# Patient Record
Sex: Female | Born: 1980 | Race: White | Hispanic: No | Smoking: Current every day smoker
Health system: Southern US, Community
[De-identification: ages and names within clinical notes are randomized; demographics above are authoritative.]

## PROBLEM LIST (undated history)

## (undated) DIAGNOSIS — R569 Unspecified convulsions: Secondary | ICD-10-CM

## (undated) HISTORY — PX: TONSILLECTOMY: SUR1361

## (undated) HISTORY — PX: CHOLECYSTECTOMY: SHX55

## (undated) HISTORY — PX: BACK SURGERY: SHX140

## (undated) HISTORY — PX: OVARY SURGERY: SHX727

---

## 2009-07-22 ENCOUNTER — Encounter: Admission: RE | Admit: 2009-07-22 | Discharge: 2009-07-22 | Payer: Self-pay | Admitting: Family Medicine

## 2010-12-12 ENCOUNTER — Encounter (HOSPITAL_COMMUNITY)
Admission: RE | Admit: 2010-12-12 | Discharge: 2010-12-12 | Disposition: A | Discharge status: Home / Self Care | Payer: BC Managed Care – PPO | Source: Ambulatory Visit | Attending: Neurological Surgery | Admitting: Neurological Surgery

## 2010-12-12 LAB — CBC
HCT: 41.3 % (ref 36.0–46.0)
Hemoglobin: 14.8 g/dL (ref 12.0–15.0)
MCHC: 35.8 g/dL (ref 30.0–36.0)
MCV: 92.4 fL (ref 78.0–100.0)
RBC: 4.47 MIL/uL (ref 3.87–5.11)
RDW: 12.3 % (ref 11.5–15.5)

## 2010-12-14 ENCOUNTER — Observation Stay (HOSPITAL_COMMUNITY)
Admission: RE | Admit: 2010-12-14 | Discharge: 2010-12-14 | Disposition: A | Discharge status: Home / Self Care | Payer: BC Managed Care – PPO | Source: Ambulatory Visit | Attending: Neurological Surgery | Admitting: Neurological Surgery

## 2010-12-14 ENCOUNTER — Ambulatory Visit (HOSPITAL_COMMUNITY): Payer: BC Managed Care – PPO

## 2010-12-14 DIAGNOSIS — M5126 Other intervertebral disc displacement, lumbar region: Principal | ICD-10-CM | POA: Insufficient documentation

## 2010-12-14 DIAGNOSIS — Z01812 Encounter for preprocedural laboratory examination: Secondary | ICD-10-CM | POA: Insufficient documentation

## 2011-01-08 NOTE — Op Note (Signed)
NAMEJENET, DURIO              ACCOUNT NO.:  [id number]  MEDICAL RECORD NO.:  [id number]           PATIENT TYPE:  O  LOCATION:  3536                         FACILITY:  [facility]  PHYSICIAN:  Stefani Dama, M.D.  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  PROCEDURE:  Microdiskectomy for recurrent herniated nucleus pulposus L4- L5 right with operating microscope and microdissection technique.  SURGEON:  Stefani Dama, MD  ANESTHESIA:  General endotracheal.  INDICATIONS:  Fantasia is a 30 year old individual who a few years back had a herniated nucleus pulposus at L4-L5 on the right.  This was operated on in [place] and she did well; however, she has developed recurrence of pain in the past several weeks with weakness in the tibialis anterior group on the right side.  Pain has been severe and she has not been tolerating pain medication and not been tolerating the pain very well with the weakness in the leg.  She was advised regarding surgical decompression procedures.  The patient is now taken to the operating room.  PROCEDURE:  The patient was brought to the operating room, supine on the stretcher.  After smooth induction of general endotracheal anesthesia, she was turned prone.  Back was prepped with alcohol and DuraPrep and draped in sterile fashion.  Previously identified centralized scar was reopened and dissection was taken down through the lumbodorsal fascia to expose the interlaminar space at L4-L5 on the right side which was identified positively with a singular radiograph.  Then, a laminotomy was created removing some overlying scar tissue and thickened  scar in the interlaminar space at L4-5 on the right side.  A medial facetectomy was performed removing the inferior margin of the facet and the medial margin of the facet at L4-L5.  The common dural tube was then identified and gradually we were able to dissect the common dural tube and nerve root, and on the lateral aspect inferior to the common dural tube just at the takeoff of the L5 nerve root there was a fragment of disk that was identified.  This was removed.  Further dissection yielded several other small fragments of disk and one of the fragments appeared to travel under the nerve root in the foramen.  A foraminotomy was created to more greatly mobilize the nerve root and several other small fragments were found in this region.  The area was inspected carefully. Further dissection yielded no other fragments of disk material.  The ligament was then carefully dissected around all the borders.  The common dural tube was noted to be free and clear.  Hemostasis in the soft tissues was meticulously obtained.  A 20 mL of 0.5% Marcaine was injected into the paraspinous fascia and musculature and then the lumbodorsal fascia was closed with 2-0 Vicryl in interrupted fashion.  A 2-0 Vicryl was used in the subcutaneous tissues, 3-0 Vicryl subcuticularly,  and Dermabond on the skin.  Blood loss was estimated less than 50 mL.  The patient tolerated the procedure well and was returned to recovery room in stable condition.     Stefani Dama, M.D.     Merla Riches  D:  12/14/2010  T:  12/15/2010  Job:  [id number]  Electronically Signed by Barnett Abu M.D. on 01/08/2011 10:13:18 AM

## 2012-06-13 ENCOUNTER — Emergency Department
Admission: EM | Admit: 2012-06-13 | Discharge: 2012-06-13 | Disposition: A | Discharge status: Home / Self Care | Payer: Self-pay | Source: Home / Self Care

## 2012-06-13 ENCOUNTER — Encounter: Payer: Self-pay | Admitting: *Deleted

## 2012-06-13 DIAGNOSIS — J40 Bronchitis, not specified as acute or chronic: Secondary | ICD-10-CM

## 2012-06-13 DIAGNOSIS — R062 Wheezing: Secondary | ICD-10-CM

## 2012-06-13 DIAGNOSIS — J069 Acute upper respiratory infection, unspecified: Secondary | ICD-10-CM

## 2012-06-13 DIAGNOSIS — F17200 Nicotine dependence, unspecified, uncomplicated: Secondary | ICD-10-CM

## 2012-06-13 MED ORDER — IPRATROPIUM BROMIDE 0.02 % IN SOLN
0.5000 mg | Freq: Once | RESPIRATORY_TRACT | Status: AC
  Administered 2012-06-13: 0.5 mg via RESPIRATORY_TRACT

## 2012-06-13 MED ORDER — ALBUTEROL SULFATE HFA 108 (90 BASE) MCG/ACT IN AERS: 2.0000 | INHALATION_SPRAY | RESPIRATORY_TRACT | Status: DC | PRN

## 2012-06-13 MED ORDER — IPRATROPIUM BROMIDE 0.02 % IN SOLN: 0.5000 mg | RESPIRATORY_TRACT | Status: DC

## 2012-06-13 MED ORDER — ALBUTEROL SULFATE (5 MG/ML) 0.5% IN NEBU
2.5000 mg | INHALATION_SOLUTION | Freq: Once | RESPIRATORY_TRACT | Status: AC
  Administered 2012-06-13: 2.5 mg via RESPIRATORY_TRACT

## 2012-06-13 MED ORDER — AZITHROMYCIN 250 MG PO TABS: ORAL_TABLET | ORAL | Status: DC

## 2012-06-13 MED ORDER — PREDNISONE 50 MG PO TABS: ORAL_TABLET | ORAL | Status: DC

## 2012-06-13 MED ORDER — ALBUTEROL SULFATE (5 MG/ML) 0.5% IN NEBU: 2.5000 mg | INHALATION_SOLUTION | RESPIRATORY_TRACT | Status: DC

## 2012-06-13 MED ORDER — METHYLPREDNISOLONE SODIUM SUCC 125 MG IJ SOLR
125.0000 mg | Freq: Once | INTRAMUSCULAR | Status: AC
  Administered 2012-06-13: 125 mg via INTRAMUSCULAR

## 2012-06-13 NOTE — ED Provider Notes (Signed)
History     CSN: [id number]  Arrival date & time 06/13/12  6213   First MD Initiated Contact with Patient 06/13/12 1013      Chief Complaint  Patient presents with  . Cough  HPI URI Symptoms Onset: 4-5 days  Description: cough, wheezing, intermittent SOB, post nasal drip Modifying factors:  + 1/2 PPD smoker   Symptoms Nasal discharge: yes Fever: yes Sore throat: mild Cough: yes Wheezing: yes Ear pain: no GI symptoms: no Sick contacts: yes; husband with similar sxs.   Red Flags  Stiff neck: no Dyspnea: yes Rash: no Swallowing difficulty: no  Sinusitis Risk Factors Headache/face pain: no Double sickening: no tooth pain: no  Allergy Risk Factors Sneezing: no Itchy scratchy throat: no Seasonal symptoms: no  Flu Risk Factors Headache: no muscle aches: no severe fatigue: no   History reviewed. No pertinent past medical history.  Past Surgical History  Procedure Date  . Cholecystectomy   . Back surgery     History reviewed. No pertinent family history.  History  Substance Use Topics  . Smoking status: Current Every Day Smoker -- 0.5 packs/day for 7 years    Types: Cigarettes  . Smokeless tobacco: Not on file  . Alcohol Use: No    OB History    Grav Para Term Preterm Abortions TAB SAB Ect Mult Living                  Review of Systems  All other systems reviewed and are negative.    Allergies  Review of patient's allergies indicates no known allergies.  Home Medications  No current outpatient prescriptions on file.  BP 124/85  Pulse 72  Temp 98 F (36.7 C) (Oral)  Resp 16  Ht 5\' 4"  (1.626 m)  Wt 187 lb (84.823 kg)  BMI 32.10 kg/m2  SpO2 95%  Physical Exam  Constitutional: She appears well-developed and well-nourished.  HENT:  Head: Normocephalic and atraumatic.  Right Ear: External ear normal.  Left Ear: External ear normal.       +nasal erythema, rhinorrhea bilaterally, + post oropharyngeal erythema    Eyes: Conjunctivae  normal are normal. Pupils are equal, round, and reactive to light.  Neck: Normal range of motion. Neck supple.  Cardiovascular: Normal rate, regular rhythm and normal heart sounds.   Pulmonary/Chest: Effort normal.       Diffuse inspiratory and expiratory wheezes.  Minimal rales    Abdominal: Soft. Bowel sounds are normal.  Musculoskeletal: Normal range of motion.  Lymphadenopathy:    She has no cervical adenopathy.  Neurological: She is alert.  Skin: Skin is warm.    ED Course  Procedures (including critical care time)  Labs Reviewed - No data to display No results found.   1. URI (upper respiratory infection)   2. Bronchitis   3. Smoker       MDM  Suspect viral induced obstructive airway disease flare.  Solumedrol 125mg  IM x 1.  Duoneb x1. Noted improved aeration with use.  Will place on zpak for atypical coverage.  Prednisone x 5 days.  Discussed smoking cessation. Handout given about this.  Resp red flags reviewed.  Followup as needed.      The patient and/or caregiver has been counseled thoroughly with regard to treatment plan and/or medications prescribed including dosage, schedule, interactions, rationale for use, and possible side effects and they verbalize understanding. Diagnoses and expected course of recovery discussed and will return if not improved as expected or if the  condition worsens. Patient and/or caregiver verbalized understanding.             Doree Albee, MD 06/13/12 1051

## 2012-06-13 NOTE — ED Notes (Signed)
Patient c/o productive cough, runny nose, fever and HA x 4 days. Taken Dayquil OTC

## 2013-07-21 ENCOUNTER — Emergency Department (HOSPITAL_COMMUNITY)
Admission: EM | Admit: 2013-07-21 | Discharge: 2013-07-21 | Disposition: A | Discharge status: Home / Self Care | Payer: Self-pay | Attending: Emergency Medicine | Admitting: Emergency Medicine

## 2013-07-21 ENCOUNTER — Emergency Department (HOSPITAL_COMMUNITY): Payer: Self-pay

## 2013-07-21 ENCOUNTER — Encounter (HOSPITAL_COMMUNITY): Payer: Self-pay | Admitting: Emergency Medicine

## 2013-07-21 DIAGNOSIS — F17200 Nicotine dependence, unspecified, uncomplicated: Secondary | ICD-10-CM | POA: Insufficient documentation

## 2013-07-21 DIAGNOSIS — R5381 Other malaise: Secondary | ICD-10-CM | POA: Insufficient documentation

## 2013-07-21 DIAGNOSIS — R32 Unspecified urinary incontinence: Secondary | ICD-10-CM | POA: Insufficient documentation

## 2013-07-21 DIAGNOSIS — R209 Unspecified disturbances of skin sensation: Secondary | ICD-10-CM | POA: Insufficient documentation

## 2013-07-21 DIAGNOSIS — M549 Dorsalgia, unspecified: Secondary | ICD-10-CM | POA: Insufficient documentation

## 2013-07-21 DIAGNOSIS — M25559 Pain in unspecified hip: Secondary | ICD-10-CM | POA: Insufficient documentation

## 2013-07-21 LAB — BASIC METABOLIC PANEL
Creatinine, Ser: 0.68 mg/dL (ref 0.50–1.10)
Potassium: 3.9 mEq/L (ref 3.5–5.1)

## 2013-07-21 LAB — CBC
HCT: 40.6 % (ref 36.0–46.0)
Hemoglobin: 14.5 g/dL (ref 12.0–15.0)
MCH: 33.3 pg (ref 26.0–34.0)
MCHC: 35.7 g/dL (ref 30.0–36.0)
Platelets: 272 10*3/uL (ref 150–400)
RBC: 4.36 MIL/uL (ref 3.87–5.11)
RDW: 12.2 % (ref 11.5–15.5)

## 2013-07-21 LAB — URINALYSIS, ROUTINE W REFLEX MICROSCOPIC
Bilirubin Urine: NEGATIVE
Hgb urine dipstick: NEGATIVE
Leukocytes, UA: NEGATIVE
Nitrite: NEGATIVE
Protein, ur: NEGATIVE mg/dL
pH: 6.5 (ref 5.0–8.0)

## 2013-07-21 MED ORDER — GADOBENATE DIMEGLUMINE 529 MG/ML IV SOLN
18.0000 mL | Freq: Once | INTRAVENOUS | Status: AC
  Administered 2013-07-21: 18 mL via INTRAVENOUS

## 2013-07-21 MED ORDER — HYDROMORPHONE HCL PF 1 MG/ML IJ SOLN
1.0000 mg | Freq: Once | INTRAMUSCULAR | Status: AC
  Administered 2013-07-21: 1 mg via INTRAVENOUS
  Filled 2013-07-21: qty 1

## 2013-07-21 MED ORDER — OXYCODONE-ACETAMINOPHEN 5-325 MG PO TABS: 1.0000 | ORAL_TABLET | ORAL | Status: DC | PRN

## 2013-07-21 MED ORDER — ONDANSETRON HCL 4 MG/2ML IJ SOLN
4.0000 mg | Freq: Once | INTRAMUSCULAR | Status: AC
  Administered 2013-07-21: 4 mg via INTRAVENOUS
  Filled 2013-07-21: qty 2

## 2013-07-21 NOTE — ED Provider Notes (Signed)
Pt received from [place], PA-C.  Pt presented w/ acute on chronic, non-traumatic low back pain.  MRI ordered d/t new onset LE weakness and urinary incontinence.  MRI results below.  Pt referred back to Dr. Danielle Dess as well as to primary care.  Prescribed percocet for pain.  Return precautions discussed.  9:46 PM    Mr Lumbar Spine W Wo Contrast  07/21/2013   CLINICAL DATA:  Back and right hip pain. Urinary incontinence. History 2 prior surgeries.  EXAM: MRI LUMBAR SPINE WITHOUT AND WITH CONTRAST  TECHNIQUE: Multiplanar and multiecho pulse sequences of the lumbar spine were obtained without and with intravenous contrast.  CONTRAST:  18 cc MultiHance  COMPARISON:  The 07/21/2013 plain film exam.  FINDINGS: Last fully open disk space is labeled L5-S1. Present examination incorporates from T10-11 disc space through the S3 level.  Conus T12-L1 level.  Visualized paravertebral structures unremarkable.  T10-11: Mild bulge. Mild spinal stenosis. Minimal posterior cord deflection.  T11-12: Minimal Schmorl's node deformity.  T12-L1: Negative.  L1-2: Negative.  L2-3 Negative.  L3-4: Small broad-based left posterior lateral protrusion superimposed upon disk bulge. Mild left-sided spinal stenosis.  L4-5: Prior left hemilaminectomy. Disc degeneration with bulge and superimposed small right paracentral/posterior lateral caudally extending disk protrusion with mild indentation upon the right ventral aspect of the thecal sac crowding but not compressing the right L5 nerve root as is contained in the thecal sac. Mild facet joint degenerative changes.  L5-S1: Small central disc protrusion minimally more notable to the left. Minimal indentation upon the ventral aspect of the thecal sac. Very mild facet joint degenerative changes.  IMPRESSION: T10-11 mild bulge. Mild spinal stenosis. Minimal posterior cord deflection.  L3-4 small broad-based left posterior lateral protrusion superimposed upon disk bulge. Mild left-sided spinal  stenosis.  L4-5 prior left hemilaminectomy. Disc degeneration with bulge and superimposed small right paracentral/posterior lateral caudally extending disk protrusion with mild indentation upon the right ventral aspect of the thecal sac crowding but not compressing the right L5 nerve root as is contained in the thecal sac.  L5-S1 small central disc protrusion minimally more notable to the left. Minimal indentation upon the ventral aspect of the thecal sac.   Electronically Signed   By: Bridgett Larsson M.D.   On: 07/21/2013 20:31    Otilio Miu, PA-C 07/21/13 2146

## 2013-07-21 NOTE — ED Provider Notes (Signed)
CSN: [id number]     Arrival date & time 07/21/13  1310 History   First MD Initiated Contact with Patient 07/21/13 1658     Chief Complaint  Patient presents with  . Back Pain   (Consider location/radiation/quality/duration/timing/severity/associated sxs/prior Treatment) HPI  Cassie Hebert is a 32 year old female presents the emergency department chief complaint of back and hip pain.  Patient has a known history of disc disease.  She's had 2 previous surgeries on her spine.  Patient states that she has previously been evaluated by Dr. Danielle Dess.  Patient states that last time she saw him she was going to be sent for MRI however she ran out of her insurance and has been unable to return.  Patient states that she generally takes Advil and dry stitches deal with her pain on a regular basis.  He states he has had intermittent severe hip pain with radiation to the thigh previously however he generally will go away.  Over the past week the patient has had increasing severe right hip pain.  She's had new onset numbness of the right foot.  She states it is made driving at times very difficult.  Patient also complains that her right leg occasionally gives way and caused her to fall. The patient has also had 3 episodes of urinary incontinence.  Patient states that it is not deep to pain and not being able to get to the bathroom.  Patient states that she was suddenly loses control of a full volume of her bladder.  She denies any stool incontinence.  She denies any other urinary symptoms. Denies saddle anesthesia. Denies neck stiffness, headache, rash.  Denies fever or recent procedures to back.   History reviewed. No pertinent past medical history. Past Surgical History  Procedure Laterality Date  . Cholecystectomy    . Back surgery     History reviewed. No pertinent family history. History  Substance Use Topics  . Smoking status: Current Every Day Smoker -- 0.50 packs/day for 7 years    Types: Cigarettes   . Smokeless tobacco: Not on file  . Alcohol Use: No   OB History   Grav Para Term Preterm Abortions TAB SAB Ect Mult Living                 Review of Systems Ten systems reviewed and are negative for acute change, except as noted in the HPI.    Allergies  Review of patient's allergies indicates no known allergies.  Home Medications   Current Outpatient Rx  Name  Route  Sig  Dispense  Refill  . ibuprofen (ADVIL,MOTRIN) 200 MG tablet   Oral   Take 400 mg by mouth every 6 (six) hours as needed for pain (back pain).          BP 153/90  Pulse 89  Temp(Src) 98.5 F (36.9 C) (Oral)  Resp 18  Wt 199 lb 4 oz (90.379 kg)  BMI 34.18 kg/m2  SpO2 99%  LMP 07/07/2013 Physical Exam Physical Exam  Nursing note and vitals reviewed. Constitutional: She is oriented to person, place, and time. She appears well-developed and well-nourished. No distress.  Patient appears uncomfortable HENT:  Head: Normocephalic and atraumatic.  Eyes: Conjunctivae normal and EOM are normal. Pupils are equal, round, and reactive to light. No scleral icterus.  Neck: Normal range of motion.  Cardiovascular: Normal rate, regular rhythm and normal heart sounds.  Exam reveals no gallop and no friction rub.   No murmur heard. Pulmonary/Chest: Effort normal and  breath sounds normal. No respiratory distress.  Abdominal: Soft. Bowel sounds are normal. She exhibits no distension and no mass. There is no tenderness. There is no guarding.   Musculoskeletal: The patient is tender to palpation to the distal thoracic and lumbar spine.  She has exquisite tenderness to palpation of the lumbar paraspinals.  His motion is limited to to pain.  Gait is limping and antalgic.  Patient has weakness of the left leg as compared to the right.  Difficult to elicit deep tendon reflexes bilaterally.  Trace strength with flexion of the right ankle  Neurological: She is alert and oriented to person, place, and time.  Skin: Skin is warm  and dry. She is not diaphoretic.  Digital Rectal Exam reveals sphincter with good tone. No external hemorrhoids. No masses or fissures.  ED Course  Procedures (including critical care time) Labs Review Labs Reviewed  URINALYSIS, ROUTINE W REFLEX MICROSCOPIC - Abnormal; Notable for the following:    APPearance CLOUDY (*)    All other components within normal limits  CBC  BASIC METABOLIC PANEL   Imaging Review Dg Lumbar Spine Complete  07/21/2013   CLINICAL DATA:  Low back pain with right-sided radicular symptoms  EXAM: LUMBAR SPINE - COMPLETE 4+ VIEW  COMPARISON:  December 31, 2012  FINDINGS: Frontal, lateral, spot lumbosacral lateral, and bilateral oblique views were obtained. There is no fracture or spondylolisthesis. Moderately severe disc space narrowing at L4-5 is stable. Other disc spaces appear intact. There is no appreciable facet arthropathy.  IMPRESSION: Stable disc narrowing at L4-5. No fracture or spondylolisthesis.   Electronically Signed   By: Bretta Bang M.D.   On: 07/21/2013 18:37   Dg Hip Complete Right  07/21/2013   CLINICAL DATA:  Pain  EXAM: RIGHT HIP - COMPLETE 2+ VIEW  COMPARISON:  None.  FINDINGS: Frontal pelvis as well as frontal and lateral right hip images were obtained. There is no fracture or dislocation. There is slight symmetric narrowing of both hip joints. No erosive change.  IMPRESSION: Slight symmetric narrowing of both hip joints. No fracture or dislocation.   Electronically Signed   By: Bretta Bang M.D.   On: 07/21/2013 18:38    EKG Interpretation   None       MDM   1. Back pain    Patient here with back pain, weakness, urinary incontinence.  She has pain with movement of the hip.  No acute plain films to rule out avascular necrosis.   8:12 PM BP 130/80  Pulse 74  Temp(Src) 98.5 F (36.9 C) (Oral)  Resp 18  Wt 199 lb 4 oz (90.379 kg)  BMI 34.18 kg/m2  SpO2 98%  LMP 07/07/2013 Xray without acute abnormality. Patient MRI pending.  I have given report to PA Schinlever who will assume care of the the patient .   Arthor Captain, PA-C 07/22/13 [phone]

## 2013-07-21 NOTE — ED Notes (Signed)
Schinlever PA at bedside. 

## 2013-07-21 NOTE — ED Notes (Signed)
Pt reports hx of back surgery x 2, having severe back pain for over past week, denies new injury to back. Having radiation down both legs and now numbness to feet. Reports incontinence of urine x 3 over the past week. Ambulatory at triage.

## 2013-07-21 NOTE — ED Notes (Signed)
Patient transported to X-ray 

## 2013-07-21 NOTE — ED Notes (Signed)
Pt states understanding of discharge instructions 

## 2013-07-23 NOTE — ED Provider Notes (Signed)
Medical screening examination/treatment/procedure(s) were performed by non-physician practitioner and as supervising physician I was immediately available for consultation/collaboration.  EKG Interpretation   None        Denyla Cortese R. Essica Kiker, MD 07/23/13 0019 

## 2014-05-20 ENCOUNTER — Encounter (HOSPITAL_COMMUNITY): Payer: Self-pay | Admitting: Emergency Medicine

## 2014-05-20 ENCOUNTER — Emergency Department (HOSPITAL_COMMUNITY)
Admission: EM | Admit: 2014-05-20 | Discharge: 2014-05-20 | Disposition: A | Discharge status: Home / Self Care | Payer: Self-pay | Attending: Emergency Medicine | Admitting: Emergency Medicine

## 2014-05-20 DIAGNOSIS — R5383 Other fatigue: Secondary | ICD-10-CM

## 2014-05-20 DIAGNOSIS — Z3202 Encounter for pregnancy test, result negative: Secondary | ICD-10-CM | POA: Insufficient documentation

## 2014-05-20 DIAGNOSIS — R569 Unspecified convulsions: Secondary | ICD-10-CM | POA: Insufficient documentation

## 2014-05-20 DIAGNOSIS — R5381 Other malaise: Secondary | ICD-10-CM | POA: Insufficient documentation

## 2014-05-20 DIAGNOSIS — F17200 Nicotine dependence, unspecified, uncomplicated: Secondary | ICD-10-CM | POA: Insufficient documentation

## 2014-05-20 DIAGNOSIS — Z791 Long term (current) use of non-steroidal anti-inflammatories (NSAID): Secondary | ICD-10-CM | POA: Insufficient documentation

## 2014-05-20 HISTORY — DX: Unspecified convulsions: R56.9

## 2014-05-20 LAB — URINALYSIS, ROUTINE W REFLEX MICROSCOPIC
Bilirubin Urine: NEGATIVE
GLUCOSE, UA: NEGATIVE mg/dL
Hgb urine dipstick: NEGATIVE
Ketones, ur: NEGATIVE mg/dL
Nitrite: NEGATIVE
PROTEIN: NEGATIVE mg/dL
SPECIFIC GRAVITY, URINE: 1.019 (ref 1.005–1.030)
Urobilinogen, UA: 1 mg/dL (ref 0.0–1.0)
pH: 8 (ref 5.0–8.0)

## 2014-05-20 LAB — CBC
HCT: 39.6 % (ref 36.0–46.0)
HEMOGLOBIN: 14.2 g/dL (ref 12.0–15.0)
MCH: 32.1 pg (ref 26.0–34.0)
MCHC: 35.9 g/dL (ref 30.0–36.0)
MCV: 89.4 fL (ref 78.0–100.0)
Platelets: 231 10*3/uL (ref 150–400)
RBC: 4.43 MIL/uL (ref 3.87–5.11)
RDW: 12.2 % (ref 11.5–15.5)
WBC: 10.7 10*3/uL — ABNORMAL HIGH (ref 4.0–10.5)

## 2014-05-20 LAB — BASIC METABOLIC PANEL
Anion gap: 13 (ref 5–15)
BUN: 8 mg/dL (ref 6–23)
CHLORIDE: 104 meq/L (ref 96–112)
CO2: 21 mEq/L (ref 19–32)
Calcium: 9.1 mg/dL (ref 8.4–10.5)
Creatinine, Ser: 0.59 mg/dL (ref 0.50–1.10)
GFR calc Af Amer: >90 mL/min (ref >90)
GLUCOSE: 93 mg/dL (ref 70–99)
POTASSIUM: 3.6 meq/L — AB (ref 3.7–5.3)
SODIUM: 138 meq/L (ref 137–147)

## 2014-05-20 LAB — URINE MICROSCOPIC-ADD ON

## 2014-05-20 LAB — RAPID URINE DRUG SCREEN, HOSP PERFORMED
Amphetamines: NOT DETECTED
Barbiturates: NOT DETECTED
Benzodiazepines: POSITIVE — AB
Cocaine: NOT DETECTED
Opiates: NOT DETECTED
Tetrahydrocannabinol: POSITIVE — AB

## 2014-05-20 LAB — POC URINE PREG, ED: Preg Test, Ur: NEGATIVE

## 2014-05-20 LAB — MAGNESIUM: Magnesium: 1.9 mg/dL (ref 1.5–2.5)

## 2014-05-20 LAB — PHOSPHORUS: Phosphorus: 2.6 mg/dL (ref 2.3–4.6)

## 2014-05-20 MED ORDER — LACOSAMIDE 50 MG PO TABS: 50.0000 mg | ORAL_TABLET | Freq: Two times a day (BID) | ORAL | Status: DC

## 2014-05-20 MED ORDER — LORAZEPAM 2 MG/ML IJ SOLN
1.0000 mg | Freq: Once | INTRAMUSCULAR | Status: AC
  Administered 2014-05-20: 1 mg via INTRAVENOUS
  Filled 2014-05-20: qty 1

## 2014-05-20 MED ORDER — SODIUM CHLORIDE 0.9 % IV SOLN
200.0000 mg | Freq: Once | INTRAVENOUS | Status: AC
  Administered 2014-05-20: 200 mg via INTRAVENOUS
  Filled 2014-05-20: qty 20

## 2014-05-20 NOTE — Discharge Instructions (Signed)

## 2014-05-20 NOTE — Consult Note (Addendum)
Reason for Consult:Seizure Referring Physician: Gwendolyn Grant  CC: Seizure  HPI: Cassie Hebert is an 33 y.o. female who was diagnosed with a seizure disorder about a month ago.  Husband is with her today and reports that no one witnessed the first event but the patient was holding a baby and was able to place the baby safely on a table before hitting the floor.  The patient had another at work.  Reported that she felt her legs going and was able to be placed on the ground before she fell.  Today husband was called from work because the patient was not feeling well.  When he got her she reported that she felt something was going to happen.  On the way to the hospital the patient has multiple seizures, one behind the other.  There has been no history of tongue biting or bowel/bladder incontinence.  She has been noted to have seizures while in the ED as well.  Described as tonic posturing.  Patient able to communicate during the episode.  One was aborted with ammonia.  Patient now tearful. Patient was initially placed on Dilantin but became lethargic.  Was changed to Keppra but was unable to tolerate that due to lethargy as well.  Also reports that they have been keeping her up and she is unable to sleep.  Is seeing a neurologist in [place].  Scheduled for a follow up appointment and MRI on 9/1.  Has had a head CT that was unremarkable.  Drug screen was positive for THC and methadone.   Past Medical History  Diagnosis Date  . Seizures     Past Surgical History  Procedure Laterality Date  . Cholecystectomy    . Back surgery      Family history: Father and maternal grandfather with seizures  Social History:  reports that she has been smoking Cigarettes.  She has a 3.5 pack-year smoking history. She does not have any smokeless tobacco history on file. She reports that she does not drink alcohol or use illicit drugs.  Allergies  Allergen Reactions  . Keppra [Levetiracetam] Other (See Comments)   Patient's husband described hypersensitive to keppra  taken twice daily, as of 05/20/14.     Medications: I have reviewed the patient's current medications. Prior to Admission:  Current outpatient prescriptions:levETIRAcetam (KEPPRA) 750 MG tablet, Take 750 mg by mouth 2 (two) times daily., Disp: , Rfl: ;  naproxen sodium (ANAPROX) 220 MG tablet, Take 220 mg by mouth daily as needed (for migraines)., Disp: , Rfl: ;  phenytoin (DILANTIN) 100 MG ER capsule, Take 100-300 mg by mouth 3 (three) times daily., Disp: , Rfl:   ROS: History obtained from the patient  General ROS: weight loss, poor sleep, poor appetite Psychological ROS: negative for - behavioral disorder, hallucinations, memory difficulties, mood swings or suicidal ideation Ophthalmic ROS: negative for - blurry vision, double vision, eye pain or loss of vision ENT ROS: negative for - epistaxis, nasal discharge, oral lesions, sore throat, tinnitus or vertigo Allergy and Immunology ROS: negative for - hives or itchy/watery eyes Hematological and Lymphatic ROS: negative for - bleeding problems, bruising or swollen lymph nodes Endocrine ROS: negative for - galactorrhea, hair pattern changes, polydipsia/polyuria or temperature intolerance Respiratory ROS: negative for - cough, hemoptysis, shortness of breath or wheezing Cardiovascular ROS: negative for - chest pain, dyspnea on exertion, edema or irregular heartbeat Gastrointestinal ROS: nausea Genito-Urinary ROS: negative for - dysuria, hematuria, incontinence or urinary frequency/urgency Musculoskeletal ROS: negative for - joint swelling or  muscular weakness Neurological ROS: as noted in HPI Dermatological ROS: negative for rash and skin lesion changes  Physical Examination: Blood pressure 138/78, pulse 77, resp. rate 22, height  (1.651 m), weight 72.576 kg (160 lb), last menstrual period 04/29/2014, SpO2 100.00%.  Neurologic Examination Mental Status: Alert, oriented,  thought content appropriate.  Speech fluent without evidence of aphasia.  Able to follow 3 step commands without difficulty. Cranial Nerves: II: Discs flat bilaterally; Visual fields grossly normal, pupils equal, round, reactive to light and accommodation III,IV, VI: ptosis not present, extra-ocular motions intact bilaterally V,VII: smile symmetric, facial light touch sensation normal bilaterally VIII: hearing normal bilaterally IX,X: gag reflex present XI: bilateral shoulder shrug XII: midline tongue extension Motor: Right : Upper extremity   5/5    Left:     Upper extremity   5/5  Lower extremity   5/5     Lower extremity   5/5 Tone and bulk:normal tone throughout; no atrophy noted Sensory: Pinprick and light touch intact throughout, bilaterally Deep Tendon Reflexes: 2+ and symmetric throughout Plantars: Right: downgoing   Left: downgoing Cerebellar: normal finger-to-nose and normal heel-to-shin test Gait: not tested CV: pulses palpable throughout     Laboratory Studies:   Basic Metabolic Panel:  Recent Labs Lab 05/20/14 1608  NA 138  K 3.6*  CL 104  CO2 21  GLUCOSE 93  BUN 8  CREATININE 0.59  CALCIUM 9.1    Liver Function Tests: No results found for this basename: AST, ALT, ALKPHOS, BILITOT, PROT, ALBUMIN,  in the last 168 hours No results found for this basename: LIPASE, AMYLASE,  in the last 168 hours No results found for this basename: AMMONIA,  in the last 168 hours  CBC:  Recent Labs Lab 05/20/14 1608  WBC 10.7*  HGB 14.2  HCT 39.6  MCV 89.4  PLT 231    Cardiac Enzymes: No results found for this basename: CKTOTAL, CKMB, CKMBINDEX, TROPONINI,  in the last 168 hours  BNP: No components found with this basename: POCBNP,   CBG: No results found for this basename: GLUCAP,  in the last 168 hours  Microbiology: Results for orders placed during the hospital encounter of 12/12/10  SURGICAL PCR SCREEN     Status: None   Collection Time    12/12/10   1:52 PM      Result Value Ref Range Status   MRSA, PCR NEGATIVE  NEGATIVE Final   Staphylococcus aureus    NEGATIVE Final   Value: NEGATIVE            The Xpert SA Assay (FDA     approved for NASAL specimens     only), is one component of     a comprehensive surveillance     program.  It is not intended     to diagnose infection nor to     guide or monitor treatment.    Coagulation Studies: No results found for this basename: LABPROT, INR,  in the last 72 hours  Urinalysis: No results found for this basename: COLORURINE, APPERANCEUR, LABSPEC, PHURINE, GLUCOSEU, HGBUR, BILIRUBINUR, KETONESUR, PROTEINUR, UROBILINOGEN, NITRITE, LEUKOCYTESUR,  in the last 168 hours  Lipid Panel:  No results found for this basename: chol, trig, hdl, cholhdl, vldl, ldlcalc    HgbA1C:  No results found for this basename: HGBA1C    Urine Drug Screen:   No results found for this basename: labopia, cocainscrnur, labbenz, amphetmu, thcu, labbarb    Alcohol Level: No results found for this basename:  ETH,  in the last 168 hours  Other results: EKG: sinus rhythm at 93 bpm.  Imaging: No results found.   Assessment/Plan: 33 year old female with a recent diagnosis of seizure.  Not tolerating Dilantin or Keppra.  Has a history of a positive drug screen.  Presents today with breakthrough seizures described by husband.  Events noted in ED have features suggestive of non-epileptic events.  Patient to see outpatient neurologist in 5 days.  Recommendations: 1.  Can not rule out the possibility of a combination of epileptic and non-epileptic events, therefore patient to be started on Vimpat.   IV to be administered now.   2.  Maintenance Vimpat to be continued at  BID.  Patient to speak to social work so that patient can obtain medication at no cost. 3.  Patient to keep follow up with outpatient neurologist 4.  Patient unable to drive, operate heavy machinery, perform activities at heights and  participate in water activities until release by outpatient physician. 5.  Repeat urine drug screen, magnesium, phosphorus 6.  Seizure precautions  Case discussed with Dr. Smitty Pluck, MD [facility] [phone] 05/20/2014, 6:21 PM

## 2014-05-20 NOTE — ED Notes (Signed)
Pt began posturing holding upper arms stiff pts spouse states that is what pt does when she has a seizure. Dr. Gwendolyn Grant to room

## 2014-05-20 NOTE — ED Notes (Signed)
Pt began posturing and stiffening upper arms per dr. Gwendolyn Grant administer ammonia ampule to nares done and pt opened eyes looking around room eyes tracking movement

## 2014-05-20 NOTE — ED Notes (Signed)
Pt talking with family at bedside.

## 2014-05-20 NOTE — ED Notes (Signed)
Pt talking with neurologists and follows commands speech clear

## 2014-05-20 NOTE — ED Notes (Signed)
Witnessed seizure around 1500 medics gave versed 2.5 mg x 2 doses pta pt did not hit head

## 2014-05-20 NOTE — ED Provider Notes (Signed)
CSN: [id number]     Arrival date & time 05/20/14  1558 History   First MD Initiated Contact with Patient 05/20/14 1606     Chief Complaint  Patient presents with  . Seizures     (Consider location/radiation/quality/duration/timing/severity/associated sxs/prior Treatment) Patient is a 33 y.o. female presenting with seizures. The history is provided by the patient.  Seizures Seizure activity on arrival: yes   Seizure type:  Focal Preceding symptoms: no nausea   Initial focality:  Diffuse Episode characteristics: abnormal movements and unresponsiveness   Episode characteristics: no confusion, no disorientation, no generalized shaking, no incontinence and no tongue biting   Postictal symptoms: confusion   Return to baseline: yes   Severity:  Moderate Duration:  2 minutes Timing:  Clustered Progression:  Worsening Context: change in medication (was previuosly on one medicine, was instructed to increase it - made her feel loopy, decreased back to normal amount)   Recent head injury:  No recent head injuries PTA treatment:  Diazepam (5 mg of valium, 2.5 mg then another 2.5 mg with EMS) History of seizures: yes   Similar to previous episodes: yes   Severity:  Mild Seizure control level:  Poorly controlled Current therapy:  Phenytoin   Past Medical History  Diagnosis Date  . Seizures    Past Surgical History  Procedure Laterality Date  . Cholecystectomy    . Back surgery     History reviewed. No pertinent family history. History  Substance Use Topics  . Smoking status: Current Every Day Smoker -- 0.50 packs/day for 7 years    Types: Cigarettes  . Smokeless tobacco: Not on file  . Alcohol Use: No   OB History   Grav Para Term Preterm Abortions TAB SAB Ect Mult Living                 Review of Systems  Constitutional: Negative for fever and chills.  Respiratory: Negative for cough and shortness of breath.   Neurological: Positive for seizures and weakness (legs weak  today).  All other systems reviewed and are negative.     Allergies  Review of patient's allergies indicates no known allergies.  Home Medications   Prior to Admission medications   Medication Sig Start Date End Date Taking? Authorizing Provider  ibuprofen (ADVIL,MOTRIN) 200 MG tablet Take 400 mg by mouth every 6 (six) hours as needed for pain (back pain).    Historical Provider, MD  oxyCODONE-acetaminophen (PERCOCET/ROXICET) 5-325 MG per tablet Take 1 tablet by mouth every 4 (four) hours as needed for pain. 07/21/13   Arie Sabina Schinlever, PA-C   LMP 04/29/2014 Physical Exam  Nursing note and vitals reviewed. Constitutional: She is oriented to person, place, and time. She appears well-developed and well-nourished. No distress.  HENT:  Head: Normocephalic and atraumatic.  Mouth/Throat: Oropharynx is clear and moist.  Eyes: EOM are normal. Pupils are equal, round, and reactive to light.  Neck: Normal range of motion. Neck supple.  Cardiovascular: Normal rate and regular rhythm.  Exam reveals no friction rub.   No murmur heard. Pulmonary/Chest: Effort normal and breath sounds normal. No respiratory distress. She has no wheezes. She has no rales.  Abdominal: Soft. She exhibits no distension. There is no tenderness. There is no rebound.  Musculoskeletal: Normal range of motion. She exhibits no edema.  Neurological: She is alert and oriented to person, place, and time.  Skin: No rash noted. She is not diaphoretic.    ED Course  Procedures (including critical care time)  Labs Review Labs Reviewed  CBC - Abnormal; Notable for the following:    WBC 10.7 (*)    All other components within normal limits  BASIC METABOLIC PANEL - Abnormal; Notable for the following:    Potassium 3.6 (*)    All other components within normal limits  URINALYSIS, ROUTINE W REFLEX MICROSCOPIC - Abnormal; Notable for the following:    APPearance CLOUDY (*)    Leukocytes, UA TRACE (*)    All other  components within normal limits  URINE RAPID DRUG SCREEN (HOSP PERFORMED) - Abnormal; Notable for the following:    Benzodiazepines POSITIVE (*)    Tetrahydrocannabinol POSITIVE (*)    All other components within normal limits  URINE MICROSCOPIC-ADD ON - Abnormal; Notable for the following:    Squamous Epithelial / LPF MANY (*)    Bacteria, UA FEW (*)    All other components within normal limits  MAGNESIUM  PHOSPHORUS  POC URINE PREG, ED    Imaging Review No results found.   EKG Interpretation None      MDM   Final diagnoses:  Convulsions, unspecified convulsion type    78F with hx of seizures (recent hx, began one month ago) presents with multiple seizures today. Felt weak, had a seizure at work. Had a few more clustered today with husband. Had 2 with EMS, received 2 doses of valium - 2.5 mg apiece. Seizures similar to prior seizures with tonic movements at regular intervals. Seizure activity after my exam, arms and legs with tonic movements, rhythmic. Was alert during the seizure, able to follow commands and open her eyes. Ativan given. Review of records show she was started on Dilantin by [facility] for a couple of 4-5 min grand mal seizures. Has been having trouble with dosing, initial dose wasn't controlling seizures, then increased dose made her loopy. Husband states has been taking lower dose now. Dr. Thad Ranger with Neuro saw patient - recommended loading with Vimpat and giving outpatient Rx for same. Patient feeling better, has Neuro f/u.  Elwin Mocha, MD 05/21/14 [phone]

## 2015-04-08 ENCOUNTER — Emergency Department (INDEPENDENT_AMBULATORY_CARE_PROVIDER_SITE_OTHER): Payer: PRIVATE HEALTH INSURANCE

## 2015-04-08 ENCOUNTER — Emergency Department (INDEPENDENT_AMBULATORY_CARE_PROVIDER_SITE_OTHER)
Admission: EM | Admit: 2015-04-08 | Discharge: 2015-04-08 | Disposition: A | Discharge status: Home / Self Care | Payer: PRIVATE HEALTH INSURANCE | Source: Home / Self Care | Attending: Family Medicine | Admitting: Family Medicine

## 2015-04-08 ENCOUNTER — Encounter: Payer: Self-pay | Admitting: *Deleted

## 2015-04-08 DIAGNOSIS — M25472 Effusion, left ankle: Secondary | ICD-10-CM

## 2015-04-08 DIAGNOSIS — S93402A Sprain of unspecified ligament of left ankle, initial encounter: Secondary | ICD-10-CM

## 2015-04-08 DIAGNOSIS — M7732 Calcaneal spur, left foot: Secondary | ICD-10-CM | POA: Diagnosis not present

## 2015-04-08 DIAGNOSIS — M25572 Pain in left ankle and joints of left foot: Secondary | ICD-10-CM | POA: Diagnosis not present

## 2015-04-08 DIAGNOSIS — S99912A Unspecified injury of left ankle, initial encounter: Secondary | ICD-10-CM

## 2015-04-08 DIAGNOSIS — S92102A Unspecified fracture of left talus, initial encounter for closed fracture: Secondary | ICD-10-CM

## 2015-04-08 MED ORDER — HYDROCODONE-ACETAMINOPHEN 10-325 MG PO TABS: 1.0000 | ORAL_TABLET | Freq: Three times a day (TID) | ORAL | Status: DC | PRN

## 2015-04-08 NOTE — ED Notes (Signed)
Pt reports rolling left ankle in a hole yesterday jumping over a ditch. No previous injury. Swelling present.

## 2015-04-08 NOTE — Consult Note (Signed)
   Subjective:    I'm seeing this patient as a consultation for:  Dr. Cathren HarshBeese  CC: Left talus fracture  HPI: Yesterday this pleasant 34 year old female fell into a hole, she had immediate pain, swelling over the lateral and medial ankle. She was seen in urgent care where x-rays showed what appeared to be an avulsion from the medial talus, as well as a lateral ankle sprain. Pain is severe, persistent with bruising and swelling and great difficult to bearing weight. I was called for further evaluation and definitive treatment.  Past medical history, Surgical history, Family history not pertinant except as noted below, Social history, Allergies, and medications have been entered into the medical record, reviewed, and no changes needed.   Review of Systems: No headache, visual changes, nausea, vomiting, diarrhea, constipation, dizziness, abdominal pain, skin rash, fevers, chills, night sweats, weight loss, swollen lymph nodes, body aches, joint swelling, muscle aches, chest pain, shortness of breath, mood changes, visual or auditory hallucinations.   Objective:   General: Well Developed, well nourished, and in no acute distress.  Neuro/Psych: Alert and oriented x3, extra-ocular muscles intact, able to move all 4 extremities, sensation grossly intact. Skin: Warm and dry, no rashes noted.  Respiratory: Not using accessory muscles, speaking in full sentences, trachea midline.  Cardiovascular: Pulses palpable, no extremity edema. Abdomen: Does not appear distended. Left ankle: Swollen, bruised with tenderness at the ATFL and the medial talus. Neurovascularly intact distally.  X-rays reviewed and do show an avulsion from the medial talus.  Impression and Recommendations:   This case required medical decision making of moderate complexity.  1. Fracture talus: Fracture at the medial talus, appears to be an avulsion but unclear as to the intra-articular degree of this fracture. Strap with compressive  dressing, CAM boot, we are going to get a CT scan for further delineation. Hydrocodone for pain, return to see me in one week.  I billed a fracture code for this encounter, all subsequent visits will be post-op checks in the global period. ___________________________________________ Ihor Austinhomas J. Benjamin Stainhekkekandam, M.D., ABFM., CAQSM. Primary Care and Sports Medicine [facility]  Adjunct Instructor of Family Medicine  [employer]

## 2015-04-08 NOTE — ED Provider Notes (Signed)
CSN: [id number]     Arrival date & time 04/08/15  0808 History   First MD Initiated Contact with Patient 04/08/15 [phone]     Chief Complaint  Patient presents with  . Ankle Pain  . Foot Pain     HPI Comments: Patient jumped over a ditch yesterday.  Her left foot landed in a hole and the ankle inverted.  She felt and heard a popping sound, followed by persistent left foot/ankle pain.  Patient is a 34 y.o. female presenting with ankle pain. The history is provided by the patient.  Ankle Pain Location:  Ankle and foot Time since incident:  17 hours Injury: yes   Mechanism of injury comment:  Inverted ankle Ankle location:  L ankle Foot location:  L foot Pain details:    Quality:  Aching   Radiates to:  Does not radiate   Severity:  Moderate   Onset quality:  Sudden   Duration:  17 hours   Timing:  Constant   Progression:  Unchanged Chronicity:  New Dislocation: no   Prior injury to area:  No Relieved by:  Nothing Worsened by:  Bearing weight Ineffective treatments:  Ice and elevation Associated symptoms: decreased ROM, numbness, stiffness and swelling   Associated symptoms: no muscle weakness and no tingling     Past Medical History  Diagnosis Date  . Seizures    Past Surgical History  Procedure Laterality Date  . Cholecystectomy    . Back surgery     History reviewed. No pertinent family history. History  Substance Use Topics  . Smoking status: Current Every Day Smoker -- 0.50 packs/day for 7 years    Types: Cigarettes  . Smokeless tobacco: Never Used  . Alcohol Use: No   OB History    No data available     Review of Systems  Musculoskeletal: Positive for stiffness.  All other systems reviewed and are negative.   Allergies  Keppra  Home Medications   Prior to Admission medications   Medication Sig Start Date End Date Taking? Authorizing Provider  HYDROcodone-acetaminophen (NORCO) 10-325 MG per tablet Take 1 tablet by mouth every 8 (eight) hours as  needed. 04/08/15   Monica Bectonhomas J Thekkekandam, MD   BP 145/93 mmHg  Pulse 105  Resp 16  Wt 180 lb (81.647 kg)  SpO2 99%  LMP 03/15/2015 Physical Exam  Constitutional: She is oriented to person, place, and time. She appears well-developed and well-nourished. No distress.  HENT:  Head: Normocephalic.  Eyes: Pupils are equal, round, and reactive to light.  Musculoskeletal:       Left ankle: She exhibits decreased range of motion and swelling. She exhibits no ecchymosis, no deformity, no laceration and normal pulse. Tenderness. Lateral malleolus, AITFL and CF ligament tenderness found. No medial malleolus, no posterior TFL, no head of 5th metatarsal and no proximal fibula tenderness found.       Left foot: There is decreased range of motion, tenderness and bony tenderness. There is no swelling, normal capillary refill, no crepitus, no deformity and no laceration.       Feet:  Neurological: She is alert and oriented to person, place, and time.  Skin: Skin is warm and dry.  Nursing note and vitals reviewed.   ED Course  Procedures        Imaging Review Dg Ankle Complete Left  04/08/2015   CLINICAL DATA:  Fall.  Pain.  Initial evaluation  EXAM: LEFT ANKLE COMPLETE - 3+ VIEW  COMPARISON:  None.  FINDINGS: Diffuse soft tissue swelling. Tiny bony density is noted along the medial aspect of the talus. Tiny avulsion fracture cannot be excluded .  IMPRESSION: Tiny bony density noted along the medial aspect of the talus. Tiny avulsion fracture cannot be excluded. This may be old.   Electronically Signed   By: Maisie Fus  Register   On: 04/08/2015 08:49   Dg Foot Complete Left  04/08/2015   CLINICAL DATA:  Larey Seat in hole yesterday. Left foot pain and swelling. Initial encounter.  EXAM: LEFT FOOT - COMPLETE 3+ VIEW  COMPARISON:  None.  FINDINGS: There is no evidence of fracture or dislocation. There is no evidence of arthropathy. Prominent plantar calcaneal bone spur noted. No other significant bone  abnormality identified. Soft tissues are unremarkable.  IMPRESSION: No acute findings.  Prominent plantar calcaneal bone spur.   Electronically Signed   By: Myles Rosenthal M.D.   On: 04/08/2015 08:47     MDM   1. Fracture of left talus, closed, initial encounter   2. Left ankle sprain, initial encounter    Will refer to Dr. Rodney Langton for definitive fracture management and follow-up     Lattie Haw, MD 04/08/15 1234

## 2015-04-08 NOTE — Discharge Instructions (Signed)
Ankle Fracture  A fracture is a break in a bone. The ankle joint is made up of three bones. These include the lower (distal)sections of your lower leg bones, called the tibia and fibula, along with a bone in your foot, called the talus. Depending on how bad the break is and if more than one ankle joint bone is broken, a cast or splint is used to protect and keep your injured bone from moving while it heals. Sometimes, surgery is required to help the fracture heal properly.   There are two general types of fractures:   Stable fracture. This includes a single fracture line through one bone, with no injury to ankle ligaments. A fracture of the talus that does not have any displacement (movement of the bone on either side of the fracture line) is also stable.   Unstable fracture. This includes more than one fracture line through one or more bones in the ankle joint. It also includes fractures that have displacement of the bone on either side of the fracture line.  CAUSES   A direct blow to the ankle.    Quickly and severely twisting your ankle.   Trauma, such as a car accident or falling from a significant height.  RISK FACTORS  You may be at a higher risk of ankle fracture if:   You have certain medical conditions.   You are involved in high-impact sports.   You are involved in a high-impact car accident.  SIGNS AND SYMPTOMS    Tender and swollen ankle.   Bruising around the injured ankle.   Pain on movement of the ankle.   Difficulty walking or putting weight on the ankle.   A cold foot below the site of the ankle injury. This can occur if the blood vessels passing through your injured ankle were also damaged.   Numbness in the foot below the site of the ankle injury.  DIAGNOSIS   An ankle fracture is usually diagnosed with a physical exam and X-rays. A CT scan may also be required for complex fractures.  TREATMENT   Stable fractures are treated with a cast or splint and using crutches to avoid putting  weight on your injured ankle. This is followed by an ankle strengthening program. Some patients require a special type of cast, depending on other medical problems they may have. Unstable fractures require surgery to ensure the bones heal properly. Your health care provider will tell you what type of fracture you have and the best treatment for your condition.  HOME CARE INSTRUCTIONS    Review correct crutch use with your health care provider and use your crutches as directed. Safe use of crutches is extremely important. Misuse of crutches can cause you to fall or cause injury to nerves in your hands or armpits.   Do not put weight or pressure on the injured ankle until directed by your health care provider.   To lessen the swelling, keep the injured leg elevated while sitting or lying down.   Apply ice to the injured area:   Put ice in a plastic bag.   Place a towel between your cast and the bag.   Leave the ice on for 20 minutes, 2-3 times a day.   If you have a plaster or fiberglass cast:   Do not try to scratch the skin under the cast with any objects. This can increase your risk of skin infection.   Check the skin around the cast every day. You   may put lotion on any red or sore areas.   Keep your cast dry and clean.   If you have a plaster splint:   Wear the splint as directed.   You may loosen the elastic around the splint if your toes become numb, tingle, or turn cold or blue.   Do not put pressure on any part of your cast or splint; it may break. Rest your cast only on a pillow the first 24 hours until it is fully hardened.   Your cast or splint can be protected during bathing with a plastic bag sealed to your skin with medical tape. Do not lower the cast or splint into water.   Take medicines as directed by your health care provider. Only take over-the-counter or prescription medicines for pain, discomfort, or fever as directed by your health care provider.   Do not drive a vehicle until  your health care provider specifically tells you it is safe to do so.   If your health care provider has given you a follow-up appointment, it is very important to keep that appointment. Not keeping the appointment could result in a chronic or permanent injury, pain, and disability. If you have any problem keeping the appointment, call the facility for assistance.  SEEK MEDICAL CARE IF:  You develop increased swelling or discomfort.  SEEK IMMEDIATE MEDICAL CARE IF:    Your cast gets damaged or breaks.   You have continued severe pain.   You develop new pain or swelling after the cast was put on.   Your skin or toenails below the injury turn blue or gray.   Your skin or toenails below the injury feel cold, numb, or have loss of sensitivity to touch.   There is a bad smell or pus draining from under the cast.  MAKE SURE YOU:    Understand these instructions.   Will watch your condition.   Will get help right away if you are not doing well or get worse.  Document Released: 09/07/2000 Document Revised: 09/15/2013 Document Reviewed: 04/09/2013  ExitCare Patient Information 2015 [employer]. This information is not intended to replace advice given to you by your health care provider. Make sure you discuss any questions you have with your health care provider.  Ankle Sprain  An ankle sprain is an injury to the strong, fibrous tissues (ligaments) that hold the bones of your ankle joint together.   CAUSES  An ankle sprain is usually caused by a fall or by twisting your ankle. Ankle sprains most commonly occur when you step on the outer edge of your foot, and your ankle turns inward. People who participate in sports are more prone to these types of injuries.   SYMPTOMS    Pain in your ankle. The pain may be present at rest or only when you are trying to stand or walk.   Swelling.   Bruising. Bruising may develop immediately or within 1 to 2 days after your injury.   Difficulty standing or walking,  particularly when turning corners or changing directions.  DIAGNOSIS   Your caregiver will ask you details about your injury and perform a physical exam of your ankle to determine if you have an ankle sprain. During the physical exam, your caregiver will press on and apply pressure to specific areas of your foot and ankle. Your caregiver will try to move your ankle in certain ways. An X-ray exam may be done to be sure a bone was not broken or   a ligament did not separate from one of the bones in your ankle (avulsion fracture).   TREATMENT   Certain types of braces can help stabilize your ankle. Your caregiver can make a recommendation for this. Your caregiver may recommend the use of medicine for pain. If your sprain is severe, your caregiver may refer you to a surgeon who helps to restore function to parts of your skeletal system (orthopedist) or a physical therapist.  HOME CARE INSTRUCTIONS    Apply ice to your injury for 1-2 days or as directed by your caregiver. Applying ice helps to reduce inflammation and pain.   Put ice in a plastic bag.   Place a towel between your skin and the bag.   Leave the ice on for 15-20 minutes at a time, every 2 hours while you are awake.   Only take over-the-counter or prescription medicines for pain, discomfort, or fever as directed by your caregiver.   Elevate your injured ankle above the level of your heart as much as possible for 2-3 days.   If your caregiver recommends crutches, use them as instructed. Gradually put weight on the affected ankle. Continue to use crutches or a cane until you can walk without feeling pain in your ankle.   If you have a plaster splint, wear the splint as directed by your caregiver. Do not rest it on anything harder than a pillow for the first 24 hours. Do not put weight on it. Do not get it wet. You may take it off to take a shower or bath.   You may have been given an elastic bandage to wear around your ankle to provide support. If the  elastic bandage is too tight (you have numbness or tingling in your foot or your foot becomes cold and blue), adjust the bandage to make it comfortable.   If you have an air splint, you may blow more air into it or let air out to make it more comfortable. You may take your splint off at night and before taking a shower or bath. Wiggle your toes in the splint several times per day to decrease swelling.  SEEK MEDICAL CARE IF:    You have rapidly increasing bruising or swelling.   Your toes feel extremely cold or you lose feeling in your foot.   Your pain is not relieved with medicine.  SEEK IMMEDIATE MEDICAL CARE IF:   Your toes are numb or blue.   You have severe pain that is increasing.  MAKE SURE YOU:    Understand these instructions.   Will watch your condition.   Will get help right away if you are not doing well or get worse.  Document Released: 09/10/2005 Document Revised: 06/04/2012 Document Reviewed: 09/22/2011  Exit[employer] Patient Information 2015 [employer]. This information is not intended to replace advice given to you by your health care provider. Make sure you discuss any questions you have with your health care provider.

## 2015-04-21 ENCOUNTER — Encounter: Payer: Self-pay | Admitting: Sports Medicine

## 2015-04-21 ENCOUNTER — Ambulatory Visit (INDEPENDENT_AMBULATORY_CARE_PROVIDER_SITE_OTHER): Payer: PRIVATE HEALTH INSURANCE | Admitting: Sports Medicine

## 2015-04-21 VITALS — BP 131/80 | HR 66 | Ht 64.0 in | Wt 186.0 lb

## 2015-04-21 DIAGNOSIS — S99912D Unspecified injury of left ankle, subsequent encounter: Secondary | ICD-10-CM | POA: Diagnosis not present

## 2015-04-21 MED ORDER — HYDROCODONE-ACETAMINOPHEN 10-325 MG PO TABS: 1.0000 | ORAL_TABLET | Freq: Every day | ORAL | Status: DC | PRN

## 2015-04-21 NOTE — Progress Notes (Signed)
  Subjective:    CC: Follow-up  HPI: This pleasant 34 year old female is 2 weeks post left ankle injury, initially x-rays were suggestive of a talar fracture however subsequent CT scan did show evidence of an old fracture with a possible and intra-articular loose body, but predominantly tearing at the anterior talofibular ligament. She has been in a boot for the past 2 weeks and is doing extremely well, pain-free at rest and 5/10 with activity. Continuing to improve.  Past medical history, Surgical history, Family history not pertinant except as noted below, Social history, Allergies, and medications have been entered into the medical record, reviewed, and no changes needed.   Review of Systems: No fevers, chills, night sweats, weight loss, chest pain, or shortness of breath.   Objective:    General: Well Developed, well nourished, and in no acute distress.  Neuro: Alert and oriented x3, extra-ocular muscles intact, sensation grossly intact.  HEENT: Normocephalic, atraumatic, pupils equal round reactive to light, neck supple, no masses, no lymphadenopathy, thyroid nonpalpable.  Skin: Warm and dry, no rashes. Cardiac: Regular rate and rhythm, no murmurs rubs or gallops, no lower extremity edema.  Respiratory: Clear to auscultation bilaterally. Not using accessory muscles, speaking in full sentences. Right Ankle: Only minimal visible swelling and minimal tenderness over the ATFL Range  of motion is full in all directions. Strength is 5/5 in all directions. Stable lateral and medial ligaments; squeeze test and kleiger test unremarkable; Talar dome nontender; No pain at base of 5th MT; No tenderness over cuboid; No tenderness over N spot or navicular prominence No tenderness on posterior aspects of lateral and medial malleolus No sign of peroneal tendon subluxations; Negative tarsal tunnel tinel's Able to walk 4 steps.  Impression and Recommendations:

## 2015-04-21 NOTE — Assessment & Plan Note (Signed)
CT scan did show evidence of an old fracture with an intra-articular his body but she is not having any mechanical symptoms. Pain continues to be isolated to the ATFL, and swelling has improved significantly. When not on her ankle she has no pain. I do think this likely represented a simple sprain, continue the boot for another week, transition into her ankle brace for another week, then return to see me in 2 weeks.

## 2015-05-05 ENCOUNTER — Ambulatory Visit: Payer: PRIVATE HEALTH INSURANCE | Admitting: Sports Medicine

## 2017-07-01 ENCOUNTER — Emergency Department
Admission: EM | Admit: 2017-07-01 | Discharge: 2017-07-01 | Disposition: A | Discharge status: Home / Self Care | Payer: BLUE CROSS/BLUE SHIELD | Source: Home / Self Care | Attending: Family Medicine | Admitting: Family Medicine

## 2017-07-01 ENCOUNTER — Encounter: Payer: Self-pay | Admitting: *Deleted

## 2017-07-01 ENCOUNTER — Emergency Department (INDEPENDENT_AMBULATORY_CARE_PROVIDER_SITE_OTHER): Payer: BLUE CROSS/BLUE SHIELD

## 2017-07-01 DIAGNOSIS — L720 Epidermal cyst: Secondary | ICD-10-CM | POA: Diagnosis not present

## 2017-07-01 DIAGNOSIS — R222 Localized swelling, mass and lump, trunk: Secondary | ICD-10-CM | POA: Diagnosis not present

## 2017-07-01 DIAGNOSIS — R1033 Periumbilical pain: Secondary | ICD-10-CM

## 2017-07-01 NOTE — ED Triage Notes (Signed)
Pt c/o umbilical pain x 3 days after she "pulled on" something black in her navel.

## 2017-07-01 NOTE — ED Provider Notes (Signed)
[facility]    CSN: [id number] Arrival date & time: 07/01/17  0803     History   Chief Complaint Chief Complaint  Patient presents with  . Abdominal Pain    HPI Cassie Hebert is a 36 y.o. female.   HPI  Cassie Hebert is a 36 y.o. female presenting to UC with c/o irritation and pain in and around umbilicus that started 3 days ago.  Pt noticed something black in her navel so she tried to pull it out but felt pain so she stopped.  She reports having laparoscopic surgery through her umbilicus 2 years ago for her gallbladder.  She is concerned she injured something by pulling on the black tissue she noticed.  Denies fever, chills, n/v/d. No other symptoms.  She has not taken any medication for pain or used any topical medications.  Pain is minimal at this time, more irritated and concerning to pt.    Past Medical History:  Diagnosis Date  . Seizures [facility])     Patient Active Problem List   Diagnosis Date Noted  . Left ankle injury 04/08/2015  . Convulsions/seizures (HCC) 05/20/2014    Past Surgical History:  Procedure Laterality Date  . BACK SURGERY    . CHOLECYSTECTOMY      OB History    No data available       Home Medications    Prior to Admission medications   Not on File    Family History History reviewed. No pertinent family history.  Social History Social History  Substance Use Topics  . Smoking status: Current Every Day Smoker    Packs/day: 0.50    Years: 7.00    Types: Cigarettes  . Smokeless tobacco: Never Used  . Alcohol use No     Allergies   Keppra [levetiracetam]   Review of Systems Review of Systems  Constitutional: Negative for chills and fever.  Gastrointestinal: Positive for abdominal pain. Negative for diarrhea, nausea and vomiting.  Skin: Positive for color change and wound.       Inside navel     Physical Exam Triage Vital Signs ED Triage Vitals [07/01/17 0842]  Enc Vitals Group     BP (!) 147/93   Pulse Rate 75     Resp 16     Temp 98.3 F (36.8 C)     Temp Source Oral     SpO2 98 %     Weight 169 lb (76.7 kg)     Height  (1.626 m)     Head Circumference      Peak Flow      Pain Score 6     Pain Loc      Pain Edu?      Excl. in GC?    No data found.   Updated Vital Signs BP (!) 147/93 (BP Location: Left Arm)   Pulse 75   Temp 98.3 F (36.8 C) (Oral)   Resp 16   Ht  (1.626 m)   Wt 169 lb (76.7 kg)   LMP 06/16/2017   SpO2 98%   BMI 29.01 kg/m   Visual Acuity Right Eye Distance:   Left Eye Distance:   Bilateral Distance:    Right Eye Near:   Left Eye Near:    Bilateral Near:     Physical Exam  Constitutional: She is oriented to person, place, and time. She appears well-developed and well-nourished.  HENT:  Head: Normocephalic and atraumatic.  Eyes: EOM are normal.  Neck: Normal range of motion.  Cardiovascular: Normal rate.   Pulmonary/Chest: Effort normal.  Abdominal: Soft.    Inside umbilicus: scar tissue with scant amount of darkened tissue deep inside umbilicus. No bleeding or drainage. Minimally tender. No surrounding erythema or warmth. 0.5cm nodule palpated to the left of umbilicus, minimally tender.   Musculoskeletal: Normal range of motion.  Neurological: She is alert and oriented to person, place, and time.  Skin: Skin is warm and dry. No rash noted. No erythema.  Psychiatric: She has a normal mood and affect. Her behavior is normal.  Nursing note and vitals reviewed.    UC Treatments / Results  Labs (all labs ordered are listed, but only abnormal results are displayed) Labs Reviewed - No data to display  EKG  EKG Interpretation None       Radiology US Soft Tissue Head & Neck (non-thyroid)  Result Date: 07/01/2017 CLINICAL DATA:  Palpable knot inside the umbilicus for the past 3 days. History of laparoscopic surgery approximately 2 years ago. EXAM: ULTRASOUND ABDOMEN LIMITED COMPARISON:  None. FINDINGS: There is a  well-defined approximately 1.3 x 0.7 x 0.6 cm mixed echogenic solid nodule within the subcutaneous tissues which correlates with the patient's palpable area of concern. This structure appears to contain a serpiginous communication with the adjacent overlying dermal surface (image 9). IMPRESSION: Well-defined approximately 1.3 cm solid subcutaneous nodule correlates with the patient's palpable area of concern. This structure appears to contain a serpiginous communication with the overlying dermal surface and thus may represent a sebaceous cyst, however given history of prior laparoscopy surgery, this structure may represent an epidermal inclusion cyst. Clinical correlation is advised. Electronically Signed   By: Simonne Come M.D.   On: 07/01/2017 09:56    Procedures Procedures (including critical care time)  Medications Ordered in UC Medications - No data to display   Initial Impression / Assessment and Plan / UC Course  I have reviewed the triage vital signs and the nursing notes.  Pertinent labs & imaging results that were available during my care of the patient were reviewed by me and considered in my medical decision making (see chart for details).     Hx and exam c/w epidermal inclusion cyst w/o evidence of underlying abscess or cellulitis.   Reassured pt no urgent or emergent treatment needed, however, recommend f/u with a PCP or dermatologist to have cyst removed if still bothersome of if cyst becomes larger.  Pt info packet provided.   Final Clinical Impressions(s) / UC Diagnoses   Final diagnoses:  Umbilical pain  Epidermoid cyst    New Prescriptions There are no discharge medications for this patient.    Controlled Substance Prescriptions [place] Controlled Substance Registry consulted? Not Applicable   Rolla Plate 07/01/17 1151

## 2017-10-01 DIAGNOSIS — O00102 Left tubal pregnancy without intrauterine pregnancy: Secondary | ICD-10-CM | POA: Insufficient documentation

## 2018-07-14 ENCOUNTER — Emergency Department (INDEPENDENT_AMBULATORY_CARE_PROVIDER_SITE_OTHER): Payer: BLUE CROSS/BLUE SHIELD

## 2018-07-14 ENCOUNTER — Other Ambulatory Visit: Payer: Self-pay

## 2018-07-14 ENCOUNTER — Emergency Department
Admission: EM | Admit: 2018-07-14 | Discharge: 2018-07-14 | Disposition: A | Discharge status: Home / Self Care | Payer: BLUE CROSS/BLUE SHIELD | Source: Home / Self Care | Attending: Family Medicine | Admitting: Family Medicine

## 2018-07-14 ENCOUNTER — Encounter: Payer: Self-pay | Admitting: *Deleted

## 2018-07-14 DIAGNOSIS — M25521 Pain in right elbow: Secondary | ICD-10-CM

## 2018-07-14 DIAGNOSIS — M7711 Lateral epicondylitis, right elbow: Secondary | ICD-10-CM

## 2018-07-14 MED ORDER — HYDROCODONE-ACETAMINOPHEN 5-325 MG PO TABS: ORAL_TABLET | ORAL | 0 refills | Status: DC

## 2018-07-14 NOTE — ED Provider Notes (Signed)
[facility]    CSN: [id number] Arrival date & time: 07/14/18  0802     History   Chief Complaint Chief Complaint  Patient presents with  . Arm Pain    HPI Cassie Hebert is a 37 y.o. female.   Patient complains of right elbow pain for one month, worse during the past 2 weeks.  She has swelling in her forearm in the morning, with intermittent numbness in her hand.  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  She has tried an elastic wrap in her elbow, ibuprofen, and ice alternating with heat with no improvement.  The history is provided by the patient.  Arm Pain  This is a new problem. Episode onset: 1 month ago. The problem occurs constantly. The problem has been gradually worsening. Exacerbated by: elbow movement. Nothing relieves the symptoms. Treatments tried: ibuprofen, ice/heat, wrap. The treatment provided no relief.    Past Medical History:  Diagnosis Date  . Seizures [facility])     Patient Active Problem List   Diagnosis Date Noted  . 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 Left ankle injury 04/08/2015  . Convulsions/seizures (HCC) 05/20/2014    Past Surgical History:  Procedure Laterality Date  . BACK SURGERY    . CHOLECYSTECTOMY    . OVARY SURGERY    . TONSILLECTOMY      OB History   None      Home Medications    Prior to Admission medications   Medication Sig Start Date End Date Taking? Authorizing Provider  HYDROcodone-acetaminophen (NORCO/VICODIN) 5-325 MG tablet Take one by mouth at bedtime as needed for pain.  May repeat in 4 to 6 hours PRN 07/14/18   Lattie Haw, MD    Family History History reviewed. No pertinent family history.  Social History Social History   Tobacco Use  . Smoking status: Current Every Day Smoker    Packs/day: 1.00    Years: 7.00    Pack years: 7.00    Types: Cigarettes  . Smokeless tobacco: Never Used  Substance Use Topics  . Alcohol use: No  . Drug use: No      Allergies   Keppra [levetiracetam]   Review of Systems Review of Systems  All other systems reviewed and are negative.    Physical Exam Triage Vital Signs ED Triage Vitals  Enc Vitals Group     BP 07/14/18 0829 (!) 162/98     Pulse Rate 07/14/18 0829 76     Resp 07/14/18 0829 18     Temp 07/14/18 0829 98.9 F (37.2 C)     Temp Source 07/14/18 0829 Oral     SpO2 07/14/18 0829 100 %     Weight 07/14/18 0830 166 lb (75.3 kg)     Height 07/14/18 0830 5\' 4"  (1.626 m)     Head Circumference --      Peak Flow --      Pain Score 07/14/18 0829 7     Pain Loc --      Pain Edu? --      Excl. in GC? --    No data found.  Updated Vital Signs BP (!) 162/98 (BP Location: Right Arm)   Pulse 76   Temp 98.9 F (37.2 C) (Oral)   Resp 18   Ht 5\' 4"  (1.626 m)   Wt 75.3 kg   LMP 06/23/2018   SpO2 100%   BMI 28.49 kg/m   Visual Acuity Right Eye  Distance:   Left Eye Distance:   Bilateral Distance:    Right Eye Near:   Left Eye Near:    Bilateral Near:     Physical Exam  Constitutional: She appears well-developed and well-nourished. No distress.  HENT:  Head: Normocephalic.  Eyes: Pupils are equal, round, and reactive to light.  Cardiovascular: Normal rate.  Pulmonary/Chest: Effort normal.  Musculoskeletal:       Right elbow: She exhibits decreased range of motion. She exhibits no swelling, no effusion, no deformity and no laceration. Tenderness found. Lateral epicondyle tenderness noted.       Arms: There is distinct tenderness over the right lateral epicondyle.  Palpation there during resisted dorsiflexion and supination of the wrist recreates her pain.  Shoulder and wrist have normal range of motion.  Distal neurovascular function is intact.   Neurological: She is alert.  Skin: Skin is warm and dry.  Nursing note and vitals reviewed.    UC Treatments / Results  Labs (all labs ordered are listed, but only abnormal results are displayed) Labs Reviewed - No  data to display  EKG None  Radiology Dg Elbow Complete Right  Result Date: 07/14/2018 CLINICAL DATA:  Pain x1 month without trauma EXAM: RIGHT ELBOW - COMPLETE 3+ VIEW COMPARISON:  None. FINDINGS: There is no evidence of fracture, dislocation, or joint effusion. There is no evidence of arthropathy or other focal bone abnormality. Soft tissues are unremarkable. IMPRESSION: Negative. Electronically Signed   By: Corlis Leak M.D.   On: 07/14/2018 08:54    Procedures Procedures (including critical care time)  Medications Ordered in UC Medications - No data to display  Initial Impression / Assessment and Plan / UC Course  I have reviewed the triage vital signs and the nursing notes.  Pertinent labs & imaging results that were available during my care of the patient were reviewed by me and considered in my medical decision making (see chart for details).    Note increased BP today. Patient instructed in proper application of a tennis elbow strap. Rx for Lortab at bedtime. Followup with Dr. Rodney Langton or Dr. Clementeen [facility] ([facility]) if not improving about two weeks.    Final Clinical Impressions(s) / UC Diagnoses   Final diagnoses:  Lateral epicondylitis of right elbow     Discharge Instructions     Wear elbow brace as instructed. Apply ice pack for 20 to 30 minutes, 3 to 4 times daily  Continue until pain and swelling decrease.  May take Ibuprofen 200mg , 4 tabs every 8 hours with food.  Begin range of motion and stretching exercises as tolerated.  Monitor blood pressure more frequently at different times of day and record on a calendar.  Followup with Family Doctor if BP remains elevated.    ED Prescriptions    Medication Sig Dispense Auth. Provider   HYDROcodone-acetaminophen (NORCO/VICODIN) 5-325 MG tablet Take one by mouth at bedtime as needed for pain.  May repeat in 4 to 6 hours PRN 10 tablet Cathren Harsh Tera Mater, MD         Lattie Haw,  MD 07/14/18 [phone]

## 2018-07-14 NOTE — ED Triage Notes (Signed)
Pt c/o RT arm pain x 1 mth. The pain is around her elbow and now her hand is numb. She has applied an arm band and taken IBF without relief.

## 2018-07-14 NOTE — Discharge Instructions (Signed)
Wear elbow brace as instructed. Apply ice pack for 20 to 30 minutes, 3 to 4 times daily  Continue until pain and swelling decrease.  May take Ibuprofen 200mg , 4 tabs every 8 hours with food.  Begin range of motion and stretching exercises as tolerated.  Monitor blood pressure more frequently at different times of day and record on a calendar.  Followup with Family Doctor if BP remains elevated.

## 2019-03-11 IMAGING — DX DG ELBOW COMPLETE 3+V*R*
4 series · 4 of 4 positions shown · non-contrast
Comparison: None.

CLINICAL DATA: Pain x1 month without trauma

EXAM:
RIGHT ELBOW - COMPLETE 3+ VIEW

[elbow ap]
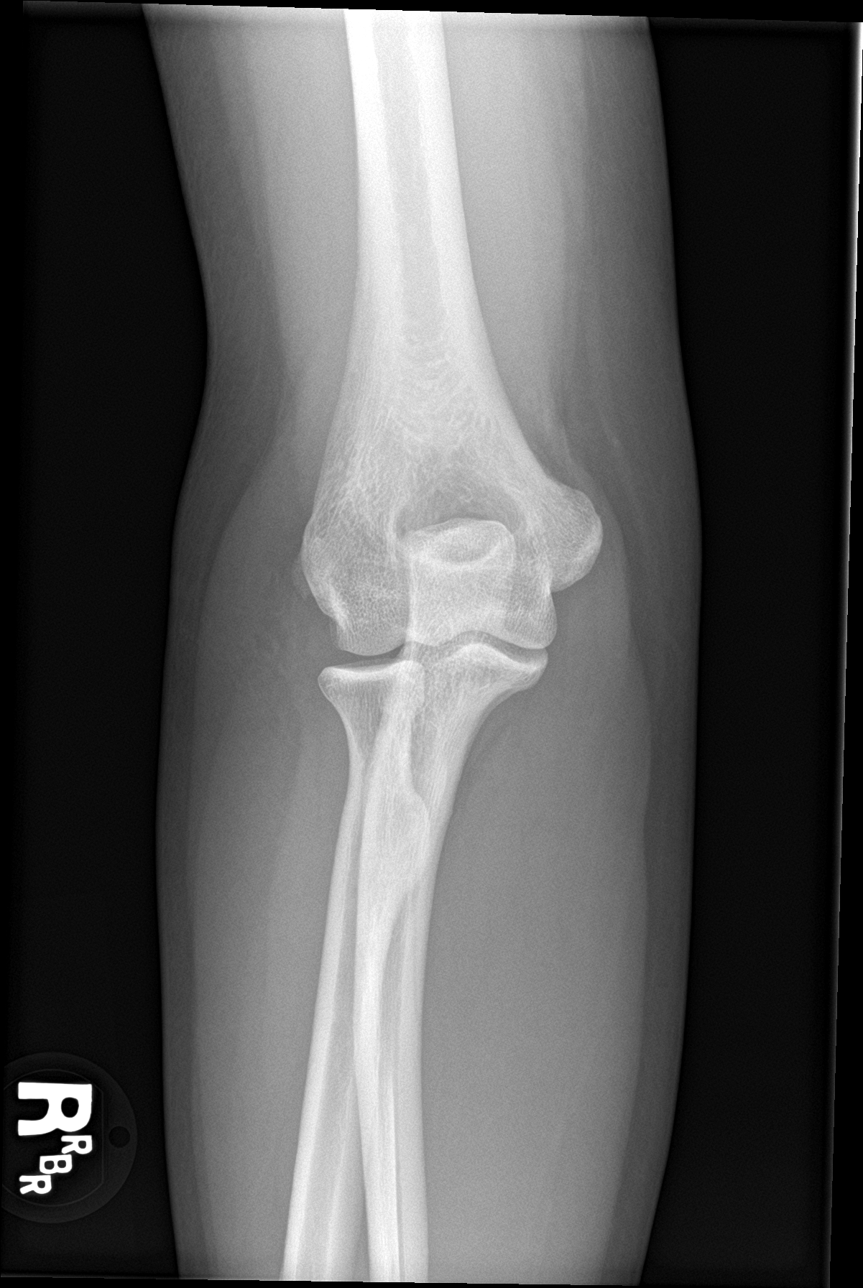

[elbow obl (1 of 2)]
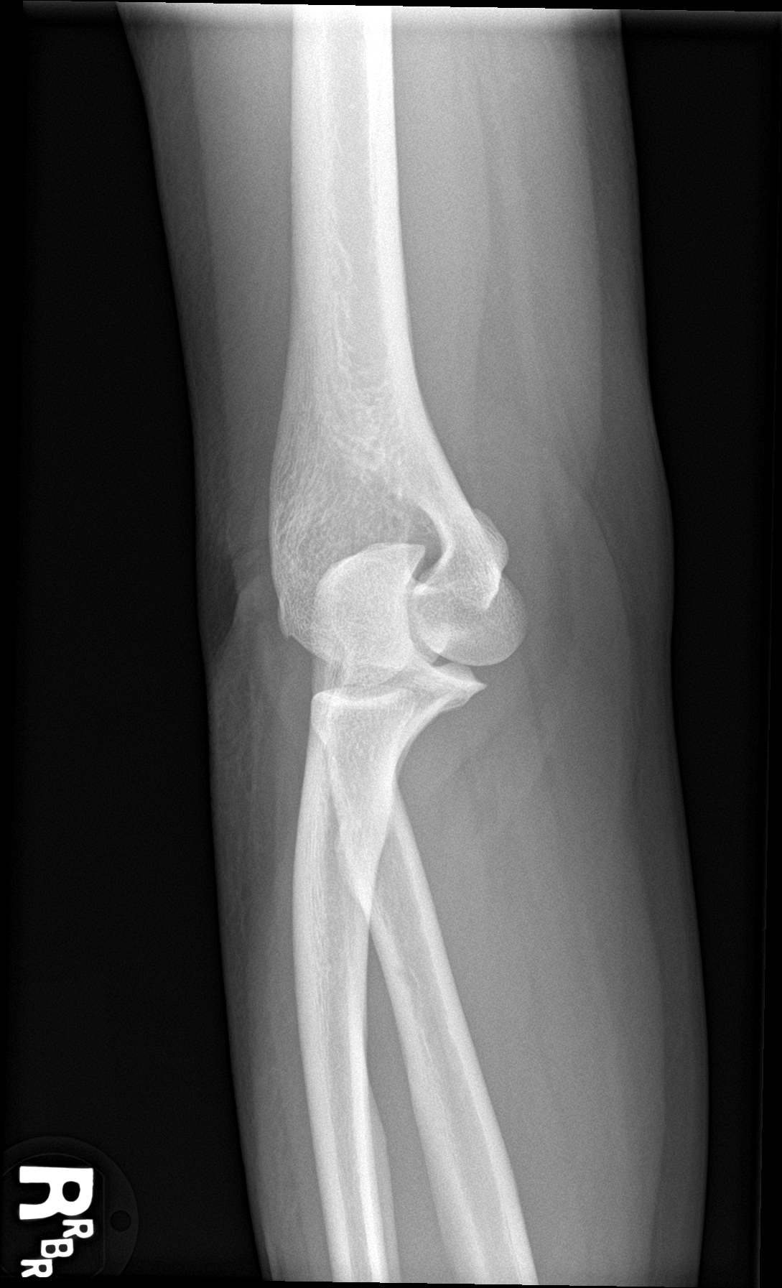

[elbow obl (2 of 2)]
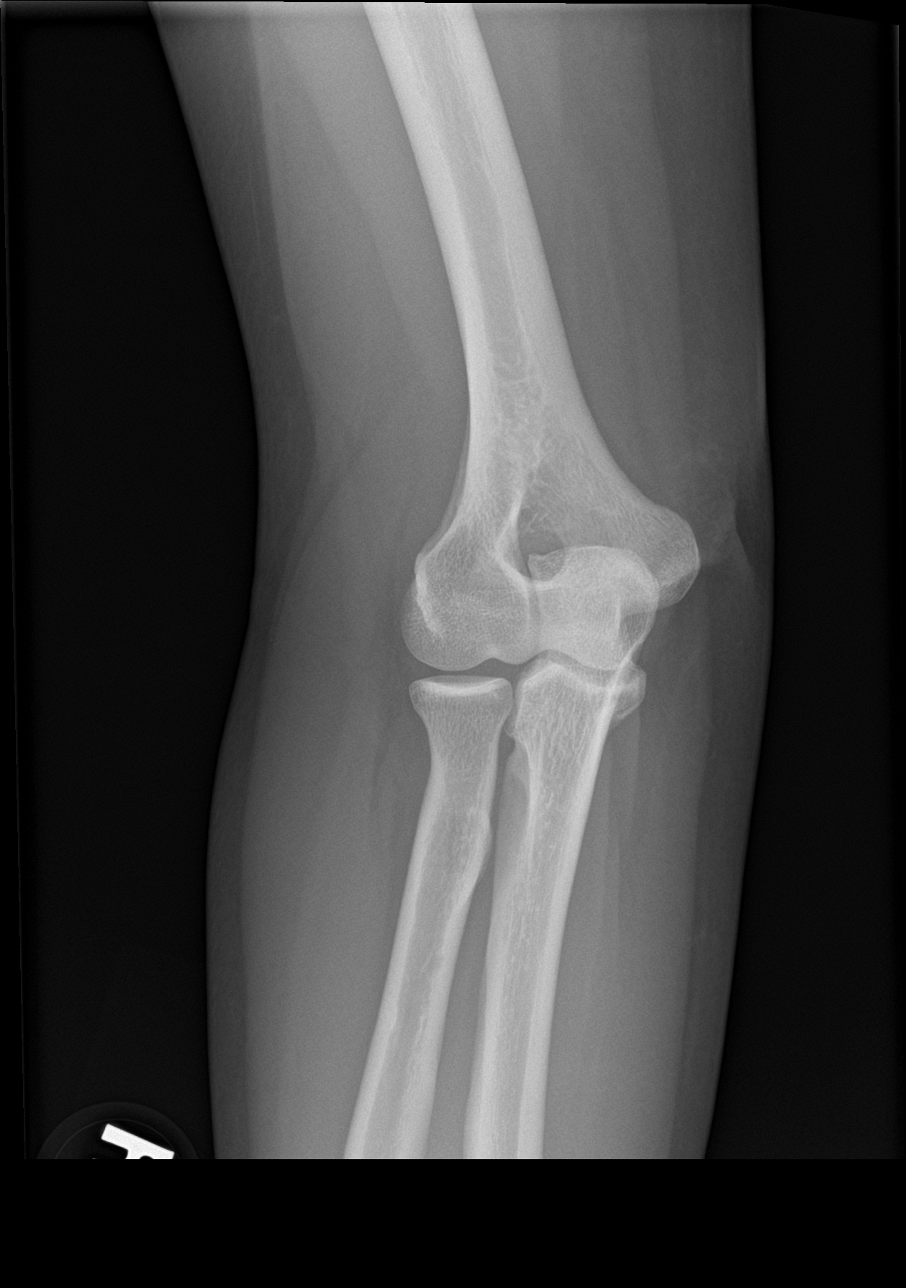

[elbow lat]
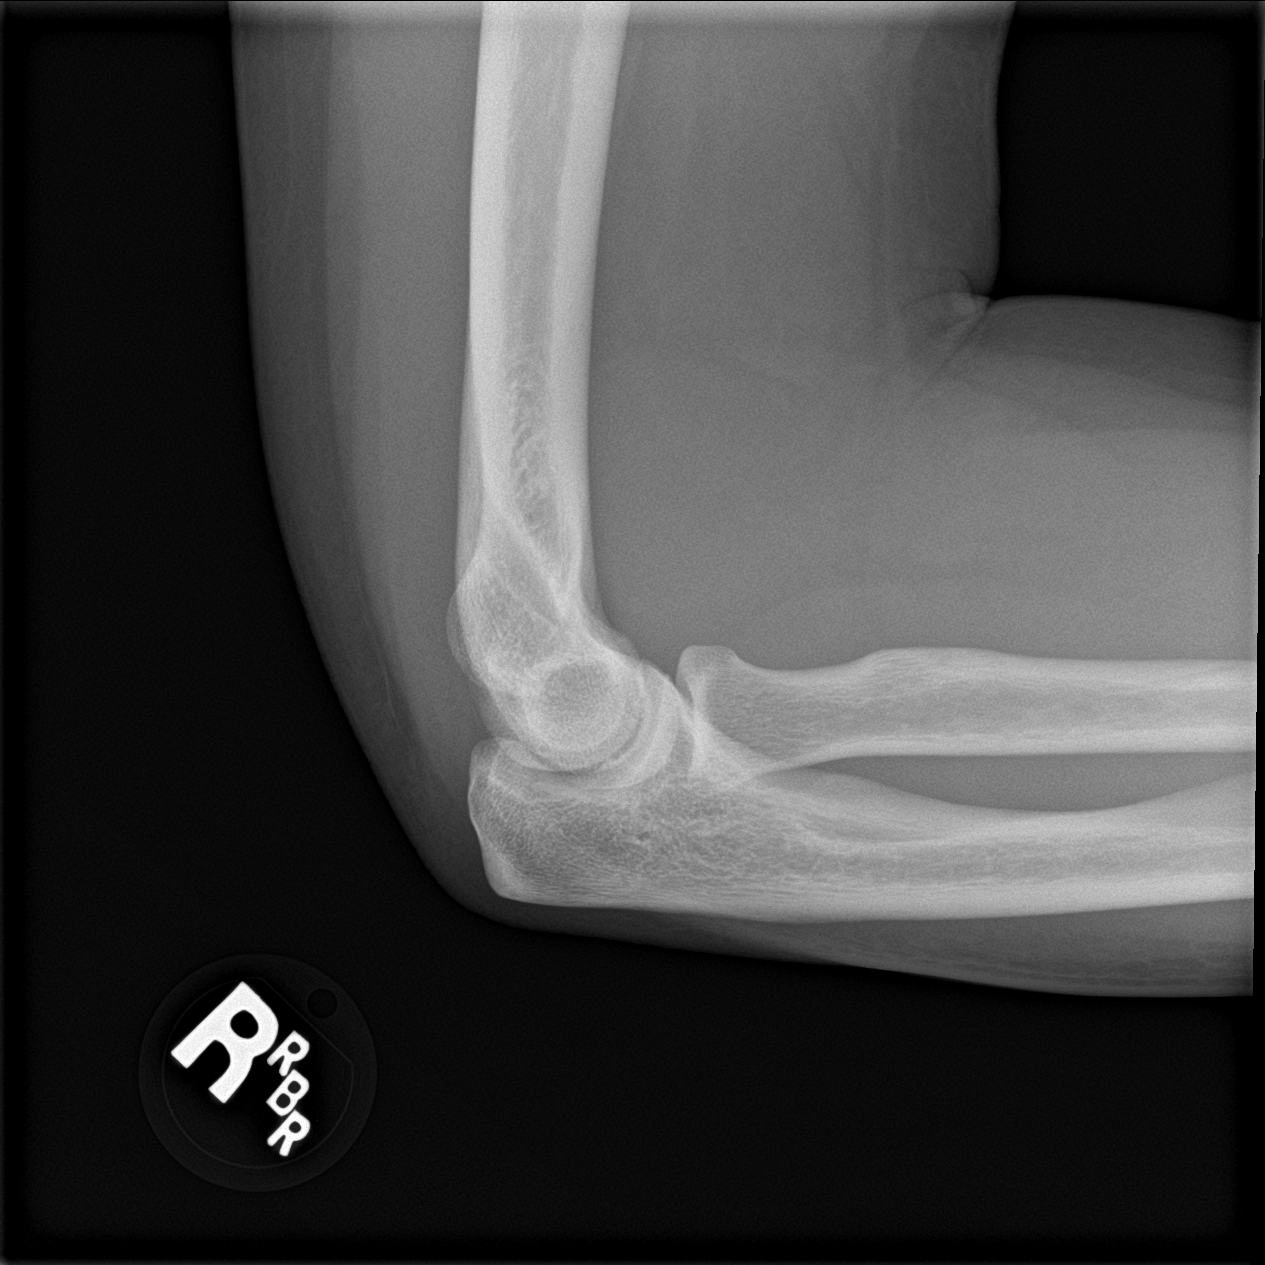

[4 of 4 positions shown; findings below may reference images not displayed]

FINDINGS: There is no evidence of fracture, dislocation, or joint effusion.
There is no evidence of arthropathy or other focal bone abnormality.
Soft tissues are unremarkable.
IMPRESSION: Negative.

## 2020-09-03 ENCOUNTER — Ambulatory Visit: Payer: Self-pay

## 2023-01-28 ENCOUNTER — Ambulatory Visit (INDEPENDENT_AMBULATORY_CARE_PROVIDER_SITE_OTHER): Payer: Medicaid Other

## 2023-01-28 ENCOUNTER — Encounter: Payer: Self-pay | Admitting: Family Medicine

## 2023-01-28 ENCOUNTER — Ambulatory Visit (INDEPENDENT_AMBULATORY_CARE_PROVIDER_SITE_OTHER): Payer: Medicaid Other | Admitting: Family Medicine

## 2023-01-28 VITALS — BP 140/81 | HR 73 | Ht 64.0 in | Wt 148.0 lb

## 2023-01-28 DIAGNOSIS — M25551 Pain in right hip: Secondary | ICD-10-CM | POA: Diagnosis not present

## 2023-01-28 DIAGNOSIS — N920 Excessive and frequent menstruation with regular cycle: Secondary | ICD-10-CM

## 2023-01-28 DIAGNOSIS — R030 Elevated blood-pressure reading, without diagnosis of hypertension: Secondary | ICD-10-CM | POA: Diagnosis not present

## 2023-01-28 LAB — CBC
Hemoglobin: 13.6 g/dL (ref 11.7–15.5)
MCH: 32.7 pg (ref 27.0–33.0)
MCV: 96.6 fL (ref 80.0–100.0)
MPV: 10.6 fL (ref 7.5–12.5)

## 2023-01-28 NOTE — Assessment & Plan Note (Signed)
-   physical exam shows pain with adduction and abduction. Tenderness to palpation of IT band  - since pain has been ongoing for 4-28months without improvement will go ahead and order xray  - have sent referral for PT  - ordering BMP to assess kidney function and if normal will go ahead and send in mobic to see if we can get patient relief.

## 2023-01-28 NOTE — Progress Notes (Signed)
Established patient visit   Patient: Cassie Hebert   DOB: Jan 01, 1981   42 y.o. Female  MRN: [id number] Visit Date: 01/28/2023  Today's healthcare provider: Charlton Amor, DO   Chief Complaint  Patient presents with   New Patient (Initial Visit)   Hip Pain    C/o  R hip pain ( states does not feel like sciatica - feels like it is in the bone )  x off and on for one year but worse in the last 4 months. Pt states she has had 2 back surgeries with DDD dignosis. She had lost weight but is slowly regaining it.     SUBJECTIVE    Chief Complaint  Patient presents with   New Patient (Initial Visit)   Hip Pain    C/o  R hip pain ( states does not feel like sciatica - feels like it is in the bone )  x off and on for one year but worse in the last 4 months. Pt states she has had 2 back surgeries with DDD dignosis. She had lost weight but is slowly regaining it.    Hip Pain    Pt presents with R hip pain that has been intermittent for one year. She does say it has gotten worse in the last 4 months. She says pain feels like it is inside her bone. She notes pain when laying on that leg. She says laying on it within two to three minutes causes her pain. When she stands for long periods she feels a heavier weight. She does have a hx of degenerative disc disease. Denies groin pain. Localizes pain down the side of her leg.   Pt also notes heavy menstrual bleeding. She has a hx of a tubal ligation and has noted since then she has had heavy bleeding where she is soaking multiple pads every few hours.    Review of Systems  Constitutional:  Negative for activity change, fatigue and fever.  Respiratory:  Negative for cough and shortness of breath.   Cardiovascular:  Negative for chest pain.  Gastrointestinal:  Negative for abdominal pain.  Genitourinary:  Negative for difficulty urinating.  Musculoskeletal:        R hip pain      No outpatient medications have been marked as taking for the  01/28/23 encounter (Office Visit) with Charlton Amor, DO.    OBJECTIVE    BP (!) 140/81   Pulse 73   Ht 5\' 4"  (1.626 m)   Wt 148 lb (67.1 kg)   SpO2 100%   BMI 25.40 kg/m   Physical Exam Vitals and nursing note reviewed.  Constitutional:      General: She is not in acute distress.    Appearance: Normal appearance.  HENT:     Head: Normocephalic and atraumatic.     Right Ear: External ear normal.     Left Ear: External ear normal.     Nose: Nose normal.  Eyes:     Conjunctiva/sclera: Conjunctivae normal.  Cardiovascular:     Rate and Rhythm: Normal rate and regular rhythm.  Pulmonary:     Effort: Pulmonary effort is normal.     Breath sounds: Normal breath sounds.  Musculoskeletal:     Comments: Tenderness to palpation of R IT Band Pain with ABDuction and ADDuction  Normal ROM with no pain, non tender to palpation of hip joint  Neurological:     General: No focal deficit present.  Mental Status: She is alert and oriented to person, place, and time.  Psychiatric:        Mood and Affect: Mood normal.        Behavior: Behavior normal.        Thought Content: Thought content normal.        Judgment: Judgment normal.        ASSESSMENT & PLAN    Problem List Items Addressed This Visit       Other   Right hip pain - Primary    - physical exam shows pain with adduction and abduction. Tenderness to palpation of IT band  - since pain has been ongoing for 4-37months without improvement will go ahead and order xray  - have sent referral for PT  - ordering BMP to assess kidney function and if normal will go ahead and send in mobic to see if we can get patient relief.      Relevant Orders   DG HIP UNILAT W OR W/O PELVIS 2-3 VIEWS RIGHT   Ambulatory referral to Physical Therapy   BASIC METABOLIC PANEL WITH GFR   Menorrhagia with regular cycle    - due to heavy menstrual cycle and hx of tubal will go ahead and refer to gynecology - have ordered cbc to check for anemia        Relevant Orders   CBC   Ambulatory referral to Obstetrics / Gynecology   Elevated blood pressure reading    Bp elevated in our visit. Will keep an eye on this.       Return in about 4 weeks (around 02/25/2023) for hip pain.      No orders of the defined types were placed in this encounter.   Orders Placed This Encounter  Procedures   DG HIP UNILAT W OR W/O PELVIS 2-3 VIEWS RIGHT    Standing Status:   Future    Standing Expiration Date:   01/28/2024    Order Specific Question:   Reason for Exam (SYMPTOM  OR DIAGNOSIS REQUIRED)    Answer:   chronic hip pain    Order Specific Question:   Is patient pregnant?    Answer:   No    Order Specific Question:   Preferred imaging location?    Answer:   [facility]    Order Specific Question:   Radiology Contrast Protocol - do NOT remove file path    Answer:   [email or web address]    Order Specific Question:   Release to patient    Answer:   Immediate   CBC   BASIC METABOLIC PANEL WITH GFR   Ambulatory referral to Physical Therapy    Referral Priority:   Routine    Referral Type:   Physical Medicine    Referral Reason:   Specialty Services Required    Requested Specialty:   Physical Therapy    Number of Visits Requested:   1   Ambulatory referral to Obstetrics / Gynecology    Referral Priority:   Routine    Referral Type:   Consultation    Referral Reason:   Specialty Services Required    Requested Specialty:   Obstetrics and Gynecology     Charlton Amor, DO  [facility] at [facility] [phone] (phone) [phone] (fax)  [facility]

## 2023-01-28 NOTE — Assessment & Plan Note (Signed)
Bp elevated in our visit. Will keep an eye on this.

## 2023-01-28 NOTE — Assessment & Plan Note (Addendum)
-   due to heavy menstrual cycle and hx of tubal will go ahead and refer to gynecology - have ordered cbc to check for anemia

## 2023-01-29 ENCOUNTER — Ambulatory Visit: Payer: Medicaid Other | Admitting: Physical Therapy

## 2023-01-29 LAB — BASIC METABOLIC PANEL WITH GFR
BUN: 11 mg/dL (ref 7–25)
CO2: 27 mmol/L (ref 20–32)
Calcium: 8.3 mg/dL — ABNORMAL LOW (ref 8.6–10.2)
Chloride: 104 mmol/L (ref 98–110)
Creat: 0.65 mg/dL (ref 0.50–0.99)
Glucose, Bld: 69 mg/dL (ref 65–99)
Potassium: 4.6 mmol/L (ref 3.5–5.3)
Sodium: 140 mmol/L (ref 135–146)
eGFR: 112 mL/min/{1.73_m2} (ref >=60)

## 2023-01-29 LAB — CBC
HCT: 40.2 % (ref 35.0–45.0)
MCHC: 33.8 g/dL (ref 32.0–36.0)
Platelets: 233 10*3/uL (ref 140–400)
RBC: 4.16 10*6/uL (ref 3.80–5.10)
RDW: 11.2 % (ref 11.0–15.0)
WBC: 6.4 10*3/uL (ref 3.8–10.8)

## 2023-02-05 ENCOUNTER — Ambulatory Visit
Admission: EM | Admit: 2023-02-05 | Discharge: 2023-02-05 | Disposition: A | Discharge status: Home / Self Care | Payer: Medicaid Other | Attending: Family Medicine | Admitting: Family Medicine

## 2023-02-05 ENCOUNTER — Encounter: Payer: Self-pay | Admitting: Emergency Medicine

## 2023-02-05 ENCOUNTER — Encounter: Payer: Self-pay | Admitting: Physical Therapy

## 2023-02-05 ENCOUNTER — Other Ambulatory Visit: Payer: Self-pay

## 2023-02-05 ENCOUNTER — Ambulatory Visit (INDEPENDENT_AMBULATORY_CARE_PROVIDER_SITE_OTHER): Payer: Medicaid Other

## 2023-02-05 ENCOUNTER — Ambulatory Visit: Payer: Medicaid Other | Attending: Family Medicine | Admitting: Physical Therapy

## 2023-02-05 DIAGNOSIS — M25551 Pain in right hip: Secondary | ICD-10-CM | POA: Diagnosis present

## 2023-02-05 DIAGNOSIS — R2689 Other abnormalities of gait and mobility: Secondary | ICD-10-CM | POA: Insufficient documentation

## 2023-02-05 DIAGNOSIS — M6281 Muscle weakness (generalized): Secondary | ICD-10-CM | POA: Insufficient documentation

## 2023-02-05 DIAGNOSIS — R053 Chronic cough: Secondary | ICD-10-CM | POA: Diagnosis not present

## 2023-02-05 DIAGNOSIS — J40 Bronchitis, not specified as acute or chronic: Secondary | ICD-10-CM

## 2023-02-05 DIAGNOSIS — R0602 Shortness of breath: Secondary | ICD-10-CM | POA: Diagnosis not present

## 2023-02-05 DIAGNOSIS — R059 Cough, unspecified: Secondary | ICD-10-CM | POA: Diagnosis not present

## 2023-02-05 DIAGNOSIS — J309 Allergic rhinitis, unspecified: Secondary | ICD-10-CM

## 2023-02-05 MED ORDER — DOXYCYCLINE HYCLATE 100 MG PO CAPS: 100.0000 mg | ORAL_CAPSULE | Freq: Two times a day (BID) | ORAL | 0 refills | Status: AC

## 2023-02-05 MED ORDER — PROMETHAZINE-DM 6.25-15 MG/5ML PO SYRP: 5.0000 mL | ORAL_SOLUTION | Freq: Two times a day (BID) | ORAL | 0 refills | Status: DC | PRN

## 2023-02-05 MED ORDER — FEXOFENADINE HCL 180 MG PO TABS: 180.0000 mg | ORAL_TABLET | Freq: Every day | ORAL | 0 refills | Status: DC

## 2023-02-05 MED ORDER — PREDNISONE 10 MG (21) PO TBPK: ORAL_TABLET | Freq: Every day | ORAL | 0 refills | Status: DC

## 2023-02-05 NOTE — ED Provider Notes (Signed)
[facility]    CSN: [id number] Arrival date & time: 02/05/23  1133      History   Chief Complaint Chief Complaint  Patient presents with   Cough    HPI Cassie Hebert is a 42 y.o. female.   HPI 42 year old female presents with cough for 1 week.  Reports cannot get a deep breath since Saturday.  PMH significant for elevated blood pressure readings, current cigarette smoker, and seizures.  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      Home Medications    Prior to Admission medications   Medication Sig Start Date End Date Taking? Authorizing Provider  doxycycline (VIBRAMYCIN) 100 MG capsule Take 1 capsule (100 mg total) by mouth 2 (two) times daily for 10 days. 02/05/23 02/15/23 Yes Trevor Iha, FNP  fexofenadine [facility] ALLERGY) 180 MG tablet Take 1 tablet (180 mg total) by mouth daily for 15 days. 02/05/23 02/20/23 Yes Trevor Iha, FNP  predniSONE (STERAPRED UNI-PAK 21 TAB) 10 MG (21) TBPK tablet Take by mouth daily. Take 6 tabs by mouth daily  for 2 days, then 5 tabs for 2 days, then 4 tabs for 2 days, then 3 tabs for 2 days, 2 tabs for 2 days, then 1 tab by mouth daily for 2 days 02/05/23  Yes Trevor Iha, FNP  promethazine-dextromethorphan (PROMETHAZINE-DM) 6.25-15 MG/5ML syrup Take 5 mLs by mouth 2 (two) times daily as needed for cough. 02/05/23  Yes Trevor Iha, FNP    Family History Family History  Problem Relation Age of Onset   Heart attack  Mother    Hepatitis C Father    Cirrhosis Father    Alcohol abuse Father    Breast cancer Maternal Grandmother     Social History Social History   Tobacco Use   Smoking status: Every Day    Packs/day: 0.50    Years: 22.00    Additional pack years: 0.00    Total pack years: 11.00    Types: Cigarettes   Smokeless tobacco: Never  Vaping Use   Vaping Use: Never used  Substance Use Topics   Alcohol use: No    Comment: former   Drug use: No    Comment: former     Allergies   Keppra [levetiracetam]   Review of Systems Review of Systems  HENT:  Positive for congestion and postnasal drip.   Respiratory:  Positive for cough and wheezing.   All other systems reviewed and are negative.    Physical Exam Triage Vital Signs ED Triage Vitals  Enc Vitals Group     BP      Pulse      Resp      Temp      Temp src      SpO2      Weight      Height      Head Circumference      Peak Flow  Pain Score      Pain Loc      Pain Edu?      Excl. in GC?    No data found.  Updated Vital Signs BP 131/82 (BP Location: Right Arm)   Pulse 79   Temp 98.6 F (37 C) (Oral)   Resp 16   Ht 5\' 4"  (1.626 m)   Wt 145 lb (65.8 kg)   LMP 01/20/2023 (Exact Date)   SpO2 99%   BMI 24.89 kg/m       Physical Exam Vitals and nursing note reviewed.  Constitutional:      Appearance: Normal appearance. She is normal weight. She is ill-appearing.  HENT:     Head: Normocephalic and atraumatic.     Right Ear: Tympanic membrane, ear canal and external ear normal.     Left Ear: Tympanic membrane, ear canal and external ear normal.     Mouth/Throat:     Mouth: Mucous membranes are moist.     Pharynx: Oropharynx is clear.     Comments: Significant amount of clear drainage of posterior oropharynx noted Eyes:     Extraocular Movements: Extraocular movements intact.     Conjunctiva/sclera: Conjunctivae normal.     Pupils: Pupils are equal, round, and reactive to light.   Cardiovascular:     Rate and Rhythm: Normal rate and regular rhythm.     Pulses: Normal pulses.     Heart sounds: Normal heart sounds.  Pulmonary:     Effort: Pulmonary effort is normal.     Breath sounds: Wheezing and rhonchi present. No rales.     Comments: Diffuse scattered rhonchi throughout with inspiratory wheezes and infrequent nonproductive cough noted Musculoskeletal:        General: Normal range of motion.     Cervical back: Normal range of motion and neck supple.  Skin:    General: Skin is warm and dry.  Neurological:     General: No focal deficit present.     Mental Status: She is alert and oriented to person, place, and time. Mental status is at baseline.      UC Treatments / Results  Labs (all labs ordered are listed, but only abnormal results are displayed) Labs Reviewed - No data to display  EKG   Radiology DG Chest 2 View  Result Date: 02/05/2023 CLINICAL DATA:  42 year old female with persistent cough. Shortness of breath with exertion for 3 days. Smoker. EXAM: CHEST - 2 VIEW COMPARISON:  None Available. FINDINGS: PA and lateral views at 1248 hours. Lung volumes and mediastinal contours are normal. Visualized tracheal air column is within normal limits. Symmetric diffuse increased pulmonary interstitium, such as smoking related. No superimposed pneumothorax, pulmonary edema, pleural effusion or confluent lung opacity. Several vessels en face near the right hilum. No acute osseous abnormality identified. Negative visible bowel gas. Cholecystectomy clips in the right upper quadrant. IMPRESSION: Probably smoking related mild pulmonary interstitial changes, alternatively consider viral/atypical respiratory infection. No other cardiopulmonary abnormality. Electronically Signed   By: Odessa Fleming M.D.   On: 02/05/2023 12:54    Procedures Procedures (including critical care time)  Medications Ordered in UC Medications - No data to display  Initial Impression /  Assessment and Plan / UC Course  I have reviewed the triage vital signs and the nursing notes.  Pertinent labs & imaging results that were available during my care of the patient were reviewed by me and considered in my medical decision making (see chart for details).  MDM: 1.  Cough, unspecified type-CXR revealed above, Rx'd Doxycycline 100 mg tablet twice daily x 10 days, Promethazine DM 6.25-15 mg / 5 mL syrup: Take 5 mL twice daily for cough; 2.  Bronchitis-Rx'd Sterapred Unipak (tapering from 60 mg to 10 mg over 10 days; 3.  Allergic rhinitis, unspecified seasonality, unspecified trigger-Rx'd Allegra 180 mg daily x 5 days, then as needed. Advised patient of chest x-ray results with hardcopy provided.  Instructed patient to take medication as directed with food to completion.  Advised patient to take Prednisone and Allegra with first dose of Doxycycline for the next 10 days.  Advised may discontinue Allegra after 5 days and use as needed for concurrent postnasal drainage/drip.  Advised may use Promethazine DM for cough prior to sleep due to sedate of effects.  Encouraged increase daily water intake to 64 ounces per day while taking these medications.  Advised if symptoms worsen and/or unresolved please follow-up with PCP or here for further evaluation.  Patient discharged home, hemodynamically stable. Final Clinical Impressions(s) / UC Diagnoses   Final diagnoses:  Cough, unspecified type  Bronchitis  Allergic rhinitis, unspecified seasonality, unspecified trigger     Discharge Instructions      Advised patient of chest x-ray results with hardcopy provided.  Instructed patient to take medication as directed with food to completion.  Advised patient to take Prednisone and Allegra with first dose of Doxycycline for the next 10 days.  Advised may discontinue Allegra after 5 days and use as needed for concurrent postnasal drainage/drip.  Advised may use Promethazine DM for cough prior to sleep  due to sedate of effects.  Encouraged increase daily water intake to 64 ounces per day while taking these medications.  Advised if symptoms worsen and/or unresolved please follow-up with PCP or here for further evaluation.     ED Prescriptions     Medication Sig Dispense Auth. Provider   doxycycline (VIBRAMYCIN) 100 MG capsule Take 1 capsule (100 mg total) by mouth 2 (two) times daily for 10 days. 20 capsule Trevor Iha, FNP   predniSONE (STERAPRED UNI-PAK 21 TAB) 10 MG (21) TBPK tablet Take by mouth daily. Take 6 tabs by mouth daily  for 2 days, then 5 tabs for 2 days, then 4 tabs for 2 days, then 3 tabs for 2 days, 2 tabs for 2 days, then 1 tab by mouth daily for 2 days 42 tablet Trevor Iha, FNP   promethazine-dextromethorphan (PROMETHAZINE-DM) 6.25-15 MG/5ML syrup Take 5 mLs by mouth 2 (two) times daily as needed for cough. 118 mL Trevor Iha, FNP   fexofenadine [facility] ALLERGY) 180 MG tablet Take 1 tablet (180 mg total) by mouth daily for 15 days. 15 tablet Trevor Iha, FNP      PDMP not reviewed this encounter.   Trevor Iha, FNP 02/05/23 1313

## 2023-02-05 NOTE — ED Triage Notes (Signed)
Cough x 1 week DOE since Sat  Pt feels like she can't get a deep breath since Sat Denies fever or chills  Theraflu last week for congestion

## 2023-02-05 NOTE — Therapy (Signed)
OUTPATIENT PHYSICAL THERAPY LOWER EXTREMITY EVALUATION   Patient Name: Cassie Hebert MRN: [id number] DOB:1981-03-26, 42 y.o., female Today's Date: 02/05/2023  END OF SESSION:  PT End of Session - 02/05/23 1054     Visit Number 1    Number of Visits 6    Date for PT Re-Evaluation 03/19/23    Authorization Type [place] Medicaid UHC    Authorization - Number of Visits 27    PT Start Time 1100    PT Stop Time 1145    PT Time Calculation (min) 45 min    Activity Tolerance Patient tolerated treatment well    Behavior During Therapy WFL for tasks assessed/performed             Past Medical History:  Diagnosis Date   Seizures (HCC)    Past Surgical History:  Procedure Laterality Date   BACK SURGERY     CHOLECYSTECTOMY     OVARY SURGERY     TONSILLECTOMY     Patient Active Problem List   Diagnosis Date Noted   Right hip pain 01/28/2023   Menorrhagia with regular cycle 01/28/2023   Elevated blood pressure reading 01/28/2023   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 Pt states it worsened the last 6 months. Pt states she is unable to tolerate pressure on her R hip. Pt states it feels deep. Pt notes she is able to move her hip in all directions. Every once in a blue moon it feels like when she stands like it may rip. Pt states she has tried exercise and massage.   PERTINENT HISTORY: Back pain  PAIN:  Are you having pain? Yes: NPRS scale: 7 or 8 at  worst/10 Pain location: R lateral thigh Pain description: throbbing, "Bruise" Aggravating factors: Laying on R side Relieving factors: Decrease pressure  PRECAUTIONS: None  WEIGHT BEARING RESTRICTIONS: No  FALLS:  Has patient fallen in last 6 months? No  LIVING ENVIRONMENT: Lives with: lives with their family Lives in: House/apartment Stairs: No Has following equipment at home: None  OCCUPATION: No issues with work  PLOF: Independent  PATIENT GOALS: Improve pain for sleeping  NEXT MD VISIT: n/a  OBJECTIVE:   DIAGNOSTIC FINDINGS: 01/28/23 hip x-ray IMPRESSION: Degenerative changes in the lower lumbar spine. No additional findings to explain the patient's pain.  PATIENT SURVEYS:  LEFS 76.3%  COGNITION: Overall cognitive status: Within functional limits for tasks assessed     SENSATION: WFL;  EDEMA:  None  MUSCLE LENGTH: Hamstrings: Right 85 deg; Left 90 deg Thomas test: did not assess  POSTURE: No Significant postural limitations  PALPATION:  Tenderness along lateral thigh, trigger points present in R glute med/max  LOWER EXTREMITY ROM:  Active ROM Right eval Left eval  Hip flexion    Hip extension    Hip abduction    Hip adduction    Hip internal rotation    Hip external rotation    Knee flexion    Knee extension    Ankle dorsiflexion  Ankle plantarflexion    Ankle inversion    Ankle eversion     (Blank rows = not tested)  LOWER EXTREMITY MMT:  MMT Right eval Left eval  Hip flexion 5 5  Hip extension 3+ 4  Hip abduction 3+ 4+  Hip adduction    Hip internal rotation 5 5  Hip external rotation 3+ 5  Knee flexion 4- 4+  Knee extension 5 5  Ankle dorsiflexion    Ankle plantarflexion    Ankle inversion    Ankle eversion     (Blank rows = not tested)  LOWER EXTREMITY SPECIAL TESTS:  Hip special tests: Luisa Hart (FABER) test: negative, Thomas test: negative, and Ober's test: negative  FUNCTIONAL TESTS:  Did not  assess  GAIT: Distance walked: 100' Assistive device utilized: None Level of assistance: Complete Independence Comments: normal pattern   TODAY'S TREATMENT:                                                                                                                              DATE: 02/05/23  See HEP below   PATIENT EDUCATION:  Education details: Exam findings, POC, initial HEP Person educated: Patient Education method: Explanation, Demonstration, and Handouts Education comprehension: verbalized understanding, returned demonstration, and needs further education  HOME EXERCISE PROGRAM: Access Code: [email or web address] URL: [email or web address] Date: 02/05/2023 Prepared by: Vernon Prey April Kirstie Peri  Exercises - Sidelying Hip Abduction  - 1 x daily - 7 x weekly - 2 sets - 10 reps - Figure 4 Bridge  - 1 x daily - 7 x weekly - 2 sets - 10 reps - Supine Piriformis Stretch with Leg Straight  - 1 x daily - 7 x weekly - 2 sets - 30 sec hold - Supine ITB Stretch with Strap  - 1 x daily - 7 x weekly - 2 sets - 30 sec hold - Standing Glute Med Mobilization with Small Ball on Wall  - 1 x daily - 7 x weekly - 2 sets - 30 sec hold  Patient Education - Trigger Point Dry Needling  ASSESSMENT:  CLINICAL IMPRESSION: Patient is a 42 y.o. F who was seen today for physical therapy evaluation and treatment for R lateral hip pain. Assessment significant for glute med/max weakness with multiple trigger points present leading to increased ITB irritation. Pt would benefit from PT to improve on these issues to decrease pain with sleep and standing activities.   OBJECTIVE IMPAIRMENTS: decreased activity tolerance, decreased endurance, decreased mobility, decreased strength, increased fascial restrictions, improper body mechanics, postural dysfunction, and pain.   ACTIVITY LIMITATIONS: standing, squatting, sleeping, transfers, and bed mobility  PARTICIPATION LIMITATIONS: community  activity  PERSONAL FACTORS: Fitness, Past/current experiences, and Time since onset of injury/illness/exacerbation are also affecting patient's functional outcome.   REHAB POTENTIAL: Good  CLINICAL DECISION MAKING: Stable/uncomplicated  EVALUATION COMPLEXITY: Low   GOALS: Goals reviewed with patient? Yes  SHORT TERM GOALS: Target date: 02/26/2023  Pt will be ind with  initial HEP Baseline: Goal status: INITIAL   LONG TERM GOALS: Target date: 03/19/2023  Pt will be ind with management and progression of HEP Baseline:  Goal status: INITIAL  2.  Pt will be able to sleep/lay on her R side Baseline:  Goal status: INITIAL  3.  Pt will have improved LEFS to >/=86.3%  Baseline:  Goal status: INITIAL  4.  Pt will have improved R hip strength to >/=4/5 to demo increased hip stability Baseline:  Goal status: INITIAL  PLAN:  PT FREQUENCY: 1x/week  PT DURATION: 6 weeks  PLANNED INTERVENTIONS: Therapeutic exercises, Therapeutic activity, Neuromuscular re-education, Balance training, Gait training, Patient/Family education, Self Care, Joint mobilization, Stair training, Dry Needling, Electrical stimulation, Cryotherapy, Moist heat, Taping, Ultrasound, Ionotophoresis 4mg /ml Dexamethasone, Manual therapy, and Re-evaluation  PLAN FOR NEXT SESSION: Assess response to HEP. Modify/progress as able. Manual work/TPDN if indicated.    Luddie Boghosian April Ma L Nyima Vanacker, PT 02/05/2023, 11:44 AM

## 2023-02-05 NOTE — Discharge Instructions (Addendum)
Advised patient of chest x-ray results with hardcopy provided.  Instructed patient to take medication as directed with food to completion.  Advised patient to take Prednisone and Allegra with first dose of Doxycycline for the next 10 days.  Advised may discontinue Allegra after 5 days and use as needed for concurrent postnasal drainage/drip.  Advised may use Promethazine DM for cough prior to sleep due to sedate of effects.  Encouraged increase daily water intake to 64 ounces per day while taking these medications.  Advised if symptoms worsen and/or unresolved please follow-up with PCP or here for further evaluation.

## 2023-02-13 ENCOUNTER — Encounter: Payer: Self-pay | Admitting: Physical Therapy

## 2023-02-13 ENCOUNTER — Ambulatory Visit: Payer: Medicaid Other | Admitting: Physical Therapy

## 2023-02-13 DIAGNOSIS — R2689 Other abnormalities of gait and mobility: Secondary | ICD-10-CM

## 2023-02-13 DIAGNOSIS — M25551 Pain in right hip: Secondary | ICD-10-CM | POA: Diagnosis not present

## 2023-02-13 DIAGNOSIS — M6281 Muscle weakness (generalized): Secondary | ICD-10-CM

## 2023-02-13 NOTE — Therapy (Signed)
OUTPATIENT PHYSICAL THERAPY LOWER EXTREMITY EVALUATION   Patient Name: Cassie Hebert MRN: [id number] DOB:1981/06/19, 42 y.o., female Today's Date: 02/13/2023  END OF SESSION:  PT End of Session - 02/13/23 1219     Visit Number 2    Number of Visits 6    Date for PT Re-Evaluation 03/19/23    Authorization Type [place] Medicaid UHC    Authorization - Visit Number 1    Authorization - Number of Visits 27    PT Start Time 1140    PT Stop Time 1219    PT Time Calculation (min) 39 min    Activity Tolerance Patient tolerated treatment well    Behavior During Therapy WFL for tasks assessed/performed              Past Medical History:  Diagnosis Date   Seizures (HCC)    Past Surgical History:  Procedure Laterality Date   BACK SURGERY     CHOLECYSTECTOMY     OVARY SURGERY     TONSILLECTOMY     Patient Active Problem List   Diagnosis Date Noted   Right hip pain 01/28/2023   Menorrhagia with regular cycle 01/28/2023   Elevated blood pressure reading 01/28/2023   Left tubal pregnancy without intrauterine pregnancy 10/01/2017   Left ankle injury 04/08/2015   Convulsions/seizures (HCC) 05/20/2014    PCP: Morey Hummingbird, DO  REFERRING PROVIDER: Morey Hummingbird, DO  REFERRING DIAG: M25.551 (ICD-10-CM) - Right hip pain  THERAPY DIAG:  Pain in right hip  Muscle weakness (generalized)  Other abnormalities of gait and mobility  Rationale for Evaluation and Treatment: Rehabilitation  ONSET DATE: exacerbation ~ 6 months  SUBJECTIVE:   SUBJECTIVE STATEMENT: Pt states she has been on prednisone for a respiratory infection so she is feeling much less pain. She states she knows she needs to continue strengthening because prednisone ends this week  PERTINENT HISTORY: Back pain  Pt reports this has been happening for the last couple of years. Pt states it worsened the last 6 months. Pt states she is unable to tolerate pressure on her R hip. Pt states it feels deep. Pt notes she is  able to move her hip in all directions. Every once in a blue moon it feels like when she stands like it may rip. Pt states she has tried exercise and massage.   PAIN:  Are you having pain? Yes: NPRS scale:  1/10 today 8 at worst/10 Pain location: R lateral thigh Pain description: throbbing, "Bruise" Aggravating factors: Laying on R side Relieving factors: Decrease pressure  PRECAUTIONS: None  WEIGHT BEARING RESTRICTIONS: No  FALLS:  Has patient fallen in last 6 months? No  LIVING ENVIRONMENT: Lives with: lives with their family Lives in: House/apartment Stairs: No Has following equipment at home: None  OCCUPATION: No issues with work  PLOF: Independent  PATIENT GOALS: Improve pain for sleeping  NEXT MD VISIT: n/a  OBJECTIVE:   DIAGNOSTIC FINDINGS: 01/28/23 hip x-ray IMPRESSION: Degenerative changes in the lower lumbar spine. No additional findings to explain the patient's pain.  PATIENT SURVEYS:  LEFS 76.3%  MUSCLE LENGTH: Hamstrings: Right 85 deg; Left 90 deg Thomas test: did not assess    PALPATION:  Tenderness along lateral thigh, trigger points present in R glute med/max    LOWER EXTREMITY MMT:  MMT Right eval Left eval  Hip flexion 5 5  Hip extension 3+ 4  Hip abduction 3+ 4+  Hip adduction    Hip internal rotation 5 5  Hip external  rotation 3+ 5  Knee flexion 4- 4+  Knee extension 5 5  Ankle dorsiflexion    Ankle plantarflexion    Ankle inversion    Ankle eversion     (Blank rows = not tested)  LOWER EXTREMITY SPECIAL TESTS:  Hip special tests: Luisa Hart (FABER) test: negative, Thomas test: negative, and Ober's test: negative  FUNCTIONAL TESTS:  Did not assess  GAIT: Distance walked: 100' Assistive device utilized: None Level of assistance: Complete Independence Comments: normal pattern   TODAY'S TREATMENT:                                                                                                                               [facility] Adult PT Treatment:                                                DATE: 02/13/23 Therapeutic Exercise: Recumbent x 5 min L3 Sidelying hip abd 2 x 10 Figure 4 bridge 2 x 10 Forearm on counter donkey kick red TB 2 x 10 Side step red TB 20' x 4 Monster walk red TB 20' x 2 Hip 3 way on slider x 10 bilat Modified single leg deadlift 10# KB x 10 bilat Squat touch 10#KB x 10 Supine ITB stretch with strap 2 x 30 sec bilat Supine piriformis stretch 2 x 30 sec bilat   DATE: 02/05/23  See HEP below   PATIENT EDUCATION:  Education details: Exam findings, POC, initial HEP Person educated: Patient Education method: Explanation, Demonstration, and Handouts Education comprehension: verbalized understanding, returned demonstration, and needs further education  HOME EXERCISE PROGRAM: Access Code: [email or web address] URL: [email or web address] Date: 02/13/2023 Prepared by: Reggy Eye  Exercises - Sidelying Hip Abduction  - 1 x daily - 7 x weekly - 2 sets - 10 reps - Figure 4 Bridge  - 1 x daily - 7 x weekly - 2 sets - 10 reps - Supine Piriformis Stretch with Leg Straight  - 1 x daily - 7 x weekly - 2 sets - 30 sec hold - Supine ITB Stretch with Strap  - 1 x daily - 7 x weekly - 2 sets - 30 sec hold - Standing Glute Med Mobilization with Small Ball on Wall  - 1 x daily - 7 x weekly - 2 sets - 30 sec hold - Side Stepping with Resistance at Ankles  - 1 x daily - 7 x weekly - 3 sets - 10 reps - Hip Extension with Single Leg Support Prone on Table Edge  - 1 x daily - 7 x weekly - 3 sets - 10 reps - Squat with Chair Touch  - 1 x daily - 7 x weekly - 3 sets - 10 reps  Patient Education - Trigger Point Dry Needling  ASSESSMENT:  CLINICAL IMPRESSION: Progressed strengthening as pt  is having less pain today. She tolerated all activities during session without increase in pain. HEP updated  OBJECTIVE IMPAIRMENTS: decreased activity tolerance, decreased endurance, decreased mobility,  decreased strength, increased fascial restrictions, improper body mechanics, postural dysfunction, and pain.     GOALS: Goals reviewed with patient? Yes  SHORT TERM GOALS: Target date: 02/26/2023  Pt will be ind with initial HEP Baseline: Goal status: INITIAL   LONG TERM GOALS: Target date: 03/19/2023  Pt will be ind with management and progression of HEP Baseline:  Goal status: INITIAL  2.  Pt will be able to sleep/lay on her R side Baseline:  Goal status: INITIAL  3.  Pt will have improved LEFS to >/=86.3%  Baseline:  Goal status: INITIAL  4.  Pt will have improved R hip strength to >/=4/5 to demo increased hip stability Baseline:  Goal status: INITIAL  PLAN:  PT FREQUENCY: 1x/week  PT DURATION: 6 weeks  PLANNED INTERVENTIONS: Therapeutic exercises, Therapeutic activity, Neuromuscular re-education, Balance training, Gait training, Patient/Family education, Self Care, Joint mobilization, Stair training, Dry Needling, Electrical stimulation, Cryotherapy, Moist heat, Taping, Ultrasound, Ionotophoresis 4mg /ml Dexamethasone, Manual therapy, and Re-evaluation  PLAN FOR NEXT SESSION: progress hip strength, Manual work/TPDN if indicated.    Annaliese Saez, PT 02/13/2023, 12:19 PM

## 2023-02-20 ENCOUNTER — Ambulatory Visit: Payer: Medicaid Other | Admitting: Physical Therapy

## 2023-02-20 ENCOUNTER — Encounter: Payer: Self-pay | Admitting: Physical Therapy

## 2023-02-20 DIAGNOSIS — M25551 Pain in right hip: Secondary | ICD-10-CM

## 2023-02-20 DIAGNOSIS — R2689 Other abnormalities of gait and mobility: Secondary | ICD-10-CM

## 2023-02-20 DIAGNOSIS — M6281 Muscle weakness (generalized): Secondary | ICD-10-CM

## 2023-02-20 NOTE — Therapy (Addendum)
OUTPATIENT PHYSICAL THERAPY LOWER EXTREMITY TREATMENT AND DISCHARGE   Patient Name: Cassie Hebert MRN: [id number] DOB:1981/09/10, 42 y.o., female Today's Date: 02/20/2023  END OF SESSION:  PT End of Session - 02/20/23 1220     Visit Number 3    Number of Visits 6    Date for PT Re-Evaluation 03/19/23    Authorization Type [place] Medicaid UHC    Authorization - Visit Number 3    Authorization - Number of Visits 27    PT Start Time 1145    PT Stop Time 1223    PT Time Calculation (min) 38 min    Activity Tolerance Patient tolerated treatment well    Behavior During Therapy WFL for tasks assessed/performed               Past Medical History:  Diagnosis Date   Seizures (HCC)    Past Surgical History:  Procedure Laterality Date   BACK SURGERY     CHOLECYSTECTOMY     OVARY SURGERY     TONSILLECTOMY     Patient Active Problem List   Diagnosis Date Noted   Right hip pain 01/28/2023   Menorrhagia with regular cycle 01/28/2023   Elevated blood pressure reading 01/28/2023   Left tubal pregnancy without intrauterine pregnancy 10/01/2017   Left ankle injury 04/08/2015   Convulsions/seizures (HCC) 05/20/2014    PCP: Morey Hummingbird, DO  REFERRING PROVIDER: Morey Hummingbird, DO  REFERRING DIAG: M65.551 (ICD-10-CM) - Right hip pain  THERAPY DIAG:  Pain in right hip  Muscle weakness (generalized)  Other abnormalities of gait and mobility  Rationale for Evaluation and Treatment: Rehabilitation  ONSET DATE: exacerbation ~ 6 months  SUBJECTIVE:   SUBJECTIVE STATEMENT: Pt states the predinsone has worn off and the pain is back but not as bad. She was chasing chickens 2 days ago and she fell on her Rt hip, pain was bad at first but is better now  PERTINENT HISTORY: Back pain  Pt reports this has been happening for the last couple of years. Pt states it worsened the last 6 months. Pt states she is unable to tolerate pressure on her R hip. Pt states it feels deep. Pt notes  she is able to move her hip in all directions. Every once in a blue moon it feels like when she stands like it may rip. Pt states she has tried exercise and massage.   PAIN:  Are you having pain? Yes: NPRS scale:  3/10 today 8 at worst/10 Pain location: R lateral thigh Pain description: throbbing, "Bruise" Aggravating factors: Laying on R side Relieving factors: Decrease pressure  PRECAUTIONS: None  WEIGHT BEARING RESTRICTIONS: No  FALLS:  Has patient fallen in last 6 months? No  LIVING ENVIRONMENT: Lives with: lives with their family Lives in: House/apartment Stairs: No Has following equipment at home: None  OCCUPATION: No issues with work  PLOF: Independent  PATIENT GOALS: Improve pain for sleeping  NEXT MD VISIT: n/a  OBJECTIVE:   DIAGNOSTIC FINDINGS: 01/28/23 hip x-ray IMPRESSION: Degenerative changes in the lower lumbar spine. No additional findings to explain the patient's pain.  PATIENT SURVEYS:  LEFS 76.3%  MUSCLE LENGTH: Hamstrings: Right 85 deg; Left 90 deg Thomas test: did not assess    PALPATION:  Tenderness along lateral thigh, trigger points present in R glute med/max    LOWER EXTREMITY MMT:  MMT Right eval Left eval  Hip flexion 5 5  Hip extension 3+ 4  Hip abduction 3+ 4+  Hip adduction  Hip internal rotation 5 5  Hip external rotation 3+ 5  Knee flexion 4- 4+  Knee extension 5 5  Ankle dorsiflexion    Ankle plantarflexion    Ankle inversion    Ankle eversion     (Blank rows = not tested)  LOWER EXTREMITY SPECIAL TESTS:  Hip special tests: Luisa Hart (FABER) test: negative, Thomas test: negative, and Ober's test: negative  FUNCTIONAL TESTS:  Did not assess  GAIT: Distance walked: 100' Assistive device utilized: None Level of assistance: Complete Independence Comments: normal pattern   TODAY'S TREATMENT:                                                                                                                               [facility] Adult PT Treatment:                                                DATE: 02/20/23 Therapeutic Exercise: Recumbent L3 x 5 min Quadruped donkey kick x 20, fire hydrant x 20 Squat tap 10# KB 2 x 10 Modified SL deadlift at wall red power band x 20 Hip 3 way on slider x 10 Monster walk green TB fwd/bkwd 20' x 2 each Supine figure 4 bridge 2 x 10 Supine ITB stretch with strap 2 x 30 sec bilat Supine piriformis stretch 2 x 30 sec bilat   [facility] Adult PT Treatment:                                                DATE: 02/13/23 Therapeutic Exercise: Recumbent x 5 min L3 Sidelying hip abd 2 x 10 Figure 4 bridge 2 x 10 Forearm on counter donkey kick red TB 2 x 10 Side step red TB 20' x 4 Monster walk red TB 20' x 2 Hip 3 way on slider x 10 bilat Modified single leg deadlift 10# KB x 10 bilat Squat touch 10#KB x 10 Supine ITB stretch with strap 2 x 30 sec bilat Supine piriformis stretch 2 x 30 sec bilat    PATIENT EDUCATION:  Education details: Exam findings, POC, initial HEP Person educated: Patient Education method: Explanation, Demonstration, and Handouts Education comprehension: verbalized understanding, returned demonstration, and needs further education  HOME EXERCISE PROGRAM: Access Code: [email or web address] URL: [email or web address] Date: 02/13/2023 Prepared by: Reggy Eye  Exercises - Sidelying Hip Abduction  - 1 x daily - 7 x weekly - 2 sets - 10 reps - Figure 4 Bridge  - 1 x daily - 7 x weekly - 2 sets - 10 reps - Supine Piriformis Stretch with Leg Straight  - 1 x daily - 7 x weekly - 2 sets - 30 sec hold - Supine ITB  Stretch with Strap  - 1 x daily - 7 x weekly - 2 sets - 30 sec hold - Standing Glute Med Mobilization with Small Ball on Wall  - 1 x daily - 7 x weekly - 2 sets - 30 sec hold - Side Stepping with Resistance at Ankles  - 1 x daily - 7 x weekly - 3 sets - 10 reps - Hip Extension with Single Leg Support Prone on Table Edge  - 1 x daily - 7 x  weekly - 3 sets - 10 reps - Squat with Chair Touch  - 1 x daily - 7 x weekly - 3 sets - 10 reps  Patient Education - Trigger Point Dry Needling  ASSESSMENT:  CLINICAL IMPRESSION: Pt with good tolerance to exercise despite predinsone wearing off. She continues to progress strength and muscular endurance  OBJECTIVE IMPAIRMENTS: decreased activity tolerance, decreased endurance, decreased mobility, decreased strength, increased fascial restrictions, improper body mechanics, postural dysfunction, and pain.     GOALS: Goals reviewed with patient? Yes  SHORT TERM GOALS: Target date: 02/26/2023  Pt will be ind with initial HEP Baseline: Goal status: MET   LONG TERM GOALS: Target date: 03/19/2023  Pt will be ind with management and progression of HEP Baseline:  Goal status: INITIAL  2.  Pt will be able to sleep/lay on her R side Baseline:  Goal status: IN PROGRESS  3.  Pt will have improved LEFS to >/=86.3%  Baseline:  Goal status: INITIAL  4.  Pt will have improved R hip strength to >/=4/5 to demo increased hip stability Baseline:  Goal status: INITIAL  PLAN:  PT FREQUENCY: 1x/week  PT DURATION: 6 weeks  PLANNED INTERVENTIONS: Therapeutic exercises, Therapeutic activity, Neuromuscular re-education, Balance training, Gait training, Patient/Family education, Self Care, Joint mobilization, Stair training, Dry Needling, Electrical stimulation, Cryotherapy, Moist heat, Taping, Ultrasound, Ionotophoresis 4mg /ml Dexamethasone, Manual therapy, and Re-evaluation  PLAN FOR NEXT SESSION: progress hip strength, Manual work/TPDN if indicated.   PHYSICAL THERAPY DISCHARGE SUMMARY  Visits from Start of Care: 3  Current functional level related to goals / functional outcomes: Decreased pain, improved strength   Remaining deficits: See above   Education / Equipment: HEP   Patient agrees to discharge. Patient goals were partially met. Patient is being discharged due to not  returning since the last visit.  Reggy Eye, PT,DPT01/30/2510:01 AM   Reggy Eye, PT 02/20/2023, 12:20 PM

## 2023-02-25 ENCOUNTER — Encounter: Payer: Self-pay | Admitting: Family Medicine

## 2023-02-25 ENCOUNTER — Ambulatory Visit (INDEPENDENT_AMBULATORY_CARE_PROVIDER_SITE_OTHER): Payer: Medicaid Other | Admitting: Family Medicine

## 2023-02-25 VITALS — BP 136/89 | HR 71 | Ht 64.0 in | Wt 150.0 lb

## 2023-02-25 DIAGNOSIS — G8929 Other chronic pain: Secondary | ICD-10-CM

## 2023-02-25 DIAGNOSIS — M545 Low back pain, unspecified: Secondary | ICD-10-CM | POA: Diagnosis not present

## 2023-02-25 DIAGNOSIS — M25551 Pain in right hip: Secondary | ICD-10-CM

## 2023-02-25 NOTE — Assessment & Plan Note (Addendum)
-   Pain is much improved with physical therapy. We will continue PT  - not currently in a flare, but if and when she gets a flare we discussed doing prednisone course

## 2023-02-25 NOTE — Assessment & Plan Note (Addendum)
-   no signs of cauda equina or other red flag symptoms - it has been years since patient has been evaluated for back pain. I will go ahead and send a referral to ortho spine so patient can discuss treatment options. She would like to avoid surgery as much as possible.

## 2023-02-25 NOTE — Progress Notes (Signed)
Established patient visit   Patient: Cassie Hebert   DOB: [birth year]   42 y.o. Female  MRN: [id number] Visit Date: 02/25/2023  Today's healthcare provider: Charlton Amor, DO   Chief Complaint  Patient presents with   Leg Pain    SUBJECTIVE    Chief Complaint  Patient presents with   Leg Pain   HPI   Pt presents for follow up on right hip ain. She was referred to PT and says this has improved greatly.  She is now having back pain. She has a chronic history of back pain and has had multiple back injections. She is noting increase pain and. She is not currently in a flare up but says it takes her a good hour to get up and moving in the morning with her back pain.   She also presents for follow up on elevated blood pressure. BP well controlled today at 136/89.  Review of Systems  Constitutional:  Negative for activity change, fatigue and fever.  Respiratory:  Negative for cough and shortness of breath.   Cardiovascular:  Negative for chest pain.  Gastrointestinal:  Negative for abdominal pain.  Genitourinary:  Negative for difficulty urinating.       No outpatient medications have been marked as taking for the 02/25/23 encounter (Office Visit) with Charlton Amor, DO.    OBJECTIVE    BP 136/89   Pulse 71   Wt 150 lb (68 kg)   LMP 01/20/2023 (Exact Date)   SpO2 100%   BMI 25.75 kg/m   Physical Exam Vitals and nursing note reviewed.  Constitutional:      General: She is not in acute distress.    Appearance: Normal appearance.  HENT:     Head: Normocephalic and atraumatic.     Right Ear: External ear normal.     Left Ear: External ear normal.     Nose: Nose normal.  Eyes:     Conjunctiva/sclera: Conjunctivae normal.  Cardiovascular:     Rate and Rhythm: Normal rate and regular rhythm.  Pulmonary:     Effort: Pulmonary effort is normal.     Breath sounds: Normal breath sounds.  Neurological:     General: No focal deficit present.     Mental Status: She  is alert and oriented to person, place, and time.  Psychiatric:        Mood and Affect: Mood normal.        Behavior: Behavior normal.        Thought Content: Thought content normal.        Judgment: Judgment normal.          ASSESSMENT & PLAN    Problem List Items Addressed This Visit       Other   Right hip pain    - Pain is much improved with physical therapy. We will continue PT  - not currently in a flare, but if and when she gets a flare we discussed doing prednisone course      Chronic bilateral low back pain without sciatica - Primary    - no signs of cauda equina or other red flag symptoms - it has been years since patient has been evaluated for back pain. I will go ahead and send a referral to ortho spine so patient can discuss treatment options. She would like to avoid surgery as much as possible.       Relevant Orders   Ambulatory referral to Orthopedics  Return in about 6 months (around 08/27/2023).      No orders of the defined types were placed in this encounter.   Orders Placed This Encounter  Procedures   Ambulatory referral to Orthopedics    Referral Priority:   Routine    Referral Type:   Consultation    Referred to Provider:   Estill Bamberg, MD    Number of Visits Requested:   1     Charlton Amor, DO  [facility] at [facility] [phone] (phone) [phone] (fax)  [facility]

## 2023-02-27 ENCOUNTER — Encounter: Payer: Medicaid Other | Admitting: Physical Therapy

## 2023-02-28 ENCOUNTER — Ambulatory Visit: Payer: Medicaid Other | Admitting: Physical Therapy

## 2023-03-06 ENCOUNTER — Ambulatory Visit: Payer: Medicaid Other | Attending: Family Medicine | Admitting: Physical Therapy

## 2023-03-06 DIAGNOSIS — R2689 Other abnormalities of gait and mobility: Secondary | ICD-10-CM | POA: Insufficient documentation

## 2023-03-06 DIAGNOSIS — M6281 Muscle weakness (generalized): Secondary | ICD-10-CM | POA: Insufficient documentation

## 2023-03-06 DIAGNOSIS — M25551 Pain in right hip: Secondary | ICD-10-CM | POA: Insufficient documentation

## 2023-03-11 ENCOUNTER — Ambulatory Visit
Admission: EM | Admit: 2023-03-11 | Discharge: 2023-03-11 | Disposition: A | Discharge status: Home / Self Care | Payer: Medicaid Other | Attending: Urgent Care | Admitting: Urgent Care

## 2023-03-11 DIAGNOSIS — S91331A Puncture wound without foreign body, right foot, initial encounter: Secondary | ICD-10-CM | POA: Diagnosis not present

## 2023-03-11 DIAGNOSIS — Z23 Encounter for immunization: Secondary | ICD-10-CM

## 2023-03-11 MED ORDER — AMOXICILLIN-POT CLAVULANATE 875-125 MG PO TABS: 1.0000 | ORAL_TABLET | Freq: Two times a day (BID) | ORAL | 0 refills | Status: AC

## 2023-03-11 MED ORDER — TETANUS-DIPHTH-ACELL PERTUSSIS 5-2.5-18.5 LF-MCG/0.5 IM SUSY
0.5000 mL | PREFILLED_SYRINGE | Freq: Once | INTRAMUSCULAR | Status: AC
  Administered 2023-03-11: 0.5 mL via INTRAMUSCULAR

## 2023-03-11 NOTE — ED Provider Notes (Signed)
[facility]    CSN: [id number] Arrival date & time: 03/11/23  0950      History   Chief Complaint Chief Complaint  Patient presents with   Puncture Wound    HPI Cassie Hebert is a 42 y.o. female.   Pleasant 42 year old female presents due to concerns to 2 puncture wounds to her right foot.  She states it started about 3:00 yesterday when she was walking around [place] an old [place].  There was a piece of wood with nails sticking out of it.  She states the puncture to the middle of her right foot barely made contact, the 1 over the fifth MTP she states was slightly deeper, but still relatively shallow.  She denies any redness swelling or irritation around the puncture wounds.  States she is still able to fully ambulate.  She was wearing a rubber soled shoe, but states that the shoe appeared to be fully intact and there was no missing pieces of the sole.  Patient is not concerned about foreign body.  Patient did soak her foot last evening and an Epsom salt bath.  She denies fevers.  She is uncertain when her last tetanus vaccine was.     Past Medical History:  Diagnosis Date   Seizures [facility])     Patient Active Problem List   Diagnosis Date Noted   Chronic bilateral low back pain without sciatica 02/25/2023   Right hip pain 01/28/2023   Menorrhagia with regular cycle 01/28/2023   Elevated blood pressure reading 01/28/2023   Left tubal pregnancy without intrauterine pregnancy 10/01/2017   Left ankle injury 04/08/2015   Convulsions/seizures (HCC) 05/20/2014    Past Surgical History:  Procedure Laterality Date   BACK SURGERY     CHOLECYSTECTOMY     OVARY SURGERY     TONSILLECTOMY      OB History   No obstetric history on file.      Home Medications    Prior to Admission medications   Medication Sig Start Date End Date Taking? Authorizing Provider  amoxicillin-clavulanate (AUGMENTIN) 875-125 MG tablet Take 1 tablet by mouth 2 (two) times daily with a meal for  5 days. 03/11/23 03/16/23 Yes Tavone Caesar L, PA  fexofenadine (ALLEGRA ALLERGY) 180 MG tablet Take 1 tablet (180 mg total) by mouth daily for 15 days. 02/05/23 02/20/23  Trevor Iha, FNP    Family History Family History  Problem Relation Age of Onset   Heart attack Mother    Hepatitis C Father    Cirrhosis Father    Alcohol abuse Father    Breast cancer Maternal Grandmother     Social History Social History   Tobacco Use   Smoking status: Every Day    Packs/day: 0.50    Years: 22.00    Additional pack years: 0.00    Total pack years: 11.00    Types: Cigarettes   Smokeless tobacco: Never  Vaping Use   Vaping Use: Never used  Substance Use Topics   Alcohol use: No    Comment: former   Drug use: No    Comment: former     Allergies   Keppra [levetiracetam]   Review of Systems Review of Systems As per HPI  Physical Exam Triage Vital Signs ED Triage Vitals  Enc Vitals Group     BP 03/11/23 1027 134/86     Pulse Rate 03/11/23 1027 71     Resp 03/11/23 1027 16     Temp 03/11/23 1027 98.6 F (  37 C)     Temp src --      SpO2 03/11/23 1027 98 %     Weight --      Height --      Head Circumference --      Peak Flow --      Pain Score 03/11/23 1026 1     Pain Loc --      Pain Edu? --      Excl. in GC? --    No data found.  Updated Vital Signs BP 134/86   Pulse 71   Temp 98.6 F (37 C)   Resp 16   LMP 02/09/2023   SpO2 98%   Visual Acuity Right Eye Distance:   Left Eye Distance:   Bilateral Distance:    Right Eye Near:   Left Eye Near:    Bilateral Near:     Physical Exam Vitals and nursing note reviewed.  Constitutional:      General: She is not in acute distress.    Appearance: Normal appearance. She is normal weight. She is not ill-appearing, toxic-appearing or diaphoretic.  HENT:     Head: Normocephalic and atraumatic.  Cardiovascular:     Pulses: Normal pulses.  Musculoskeletal:        General: Signs of injury present. No swelling,  tenderness or deformity. Normal range of motion.       Feet:  Skin:    General: Skin is warm and dry.     Capillary Refill: Capillary refill takes less than 2 seconds.     Coloration: Skin is not jaundiced.     Findings: No bruising, erythema or rash.  Neurological:     General: No focal deficit present.     Mental Status: She is alert and oriented to person, place, and time.     Sensory: No sensory deficit.     Motor: No weakness.     Gait: Gait normal.      UC Treatments / Results  Labs (all labs ordered are listed, but only abnormal results are displayed) Labs Reviewed - No data to display  EKG   Radiology No results found.  Procedures Procedures (including critical care time)  Medications Ordered in UC Medications  Tdap (BOOSTRIX) injection 0.5 mL (0.5 mLs Intramuscular Given 03/11/23 1053)    Initial Impression / Assessment and Plan / UC Course  I have reviewed the triage vital signs and the nursing notes.  Pertinent labs & imaging results that were available during my care of the patient were reviewed by me and considered in my medical decision making (see chart for details).     Puncture wound R foot - very superficial. Discussed possible xray to assess for retained FB. Pt declined, stating her shoe was completely intact and that the punctures were very superficial. No active infection, will start Augmentin for prophylaxis. Continue epsom salt soaks. Need for Tdap - updated in office today.    Final Clinical Impressions(s) / UC Diagnoses   Final diagnoses:  Puncture wound of plantar aspect of right foot, initial encounter  Need for prophylactic vaccination with combined diphtheria-tetanus-pertussis (DTP) vaccine     Discharge Instructions      Please read the attached handouts regarding puncture wounds. Soak your foot in warm Epsom salt water today.  Please take the antibiotic twice daily with food.  This will help prevent infection around the  puncture wounds.  You are updated on your tetanus vaccine today. Please read the attached handout from  the CDC.     ED Prescriptions     Medication Sig Dispense Auth. Provider   amoxicillin-clavulanate (AUGMENTIN) 875-125 MG tablet Take 1 tablet by mouth 2 (two) times daily with a meal for 5 days. 10 tablet Melinda Gwinner L, [place]      PDMP not reviewed this encounter.   Maretta Bees, [place] 03/11/23 1825

## 2023-03-11 NOTE — Discharge Instructions (Addendum)
Please read the attached handouts regarding puncture wounds. Soak your foot in warm Epsom salt water today.  Please take the antibiotic twice daily with food.  This will help prevent infection around the puncture wounds.  You are updated on your tetanus vaccine today. Please read the attached handout from the CDC.

## 2023-03-11 NOTE — ED Triage Notes (Addendum)
Pt presents to uc with right foot puncture wound to her right foot from a nail in a board. Pt reports she had on thin shoes and they did not help. Pt reports washing wound. Needs updated TDAP

## 2023-03-14 ENCOUNTER — Other Ambulatory Visit: Payer: Self-pay | Admitting: Orthopedic Surgery

## 2023-03-14 DIAGNOSIS — M5416 Radiculopathy, lumbar region: Secondary | ICD-10-CM | POA: Diagnosis not present

## 2023-03-14 DIAGNOSIS — M545 Low back pain, unspecified: Secondary | ICD-10-CM | POA: Diagnosis not present

## 2023-03-23 ENCOUNTER — Ambulatory Visit (INDEPENDENT_AMBULATORY_CARE_PROVIDER_SITE_OTHER): Payer: Medicaid Other

## 2023-03-23 DIAGNOSIS — M545 Low back pain, unspecified: Secondary | ICD-10-CM

## 2023-03-23 DIAGNOSIS — M5136 Other intervertebral disc degeneration, lumbar region: Secondary | ICD-10-CM | POA: Diagnosis not present

## 2023-03-23 DIAGNOSIS — M4807 Spinal stenosis, lumbosacral region: Secondary | ICD-10-CM | POA: Diagnosis not present

## 2023-03-23 DIAGNOSIS — M47816 Spondylosis without myelopathy or radiculopathy, lumbar region: Secondary | ICD-10-CM | POA: Diagnosis not present

## 2023-03-23 DIAGNOSIS — M48061 Spinal stenosis, lumbar region without neurogenic claudication: Secondary | ICD-10-CM | POA: Diagnosis not present

## 2023-03-29 DIAGNOSIS — M5416 Radiculopathy, lumbar region: Secondary | ICD-10-CM | POA: Diagnosis not present

## 2023-04-11 ENCOUNTER — Ambulatory Visit (INDEPENDENT_AMBULATORY_CARE_PROVIDER_SITE_OTHER): Payer: Medicaid Other | Admitting: Obstetrics and Gynecology

## 2023-04-11 ENCOUNTER — Other Ambulatory Visit (HOSPITAL_COMMUNITY)
Admission: RE | Admit: 2023-04-11 | Discharge: 2023-04-11 | Disposition: A | Discharge status: Home / Self Care | Payer: Medicaid Other | Source: Ambulatory Visit | Attending: Obstetrics and Gynecology | Admitting: Obstetrics and Gynecology

## 2023-04-11 ENCOUNTER — Encounter: Payer: Self-pay | Admitting: Obstetrics and Gynecology

## 2023-04-11 VITALS — BP 143/83 | HR 74 | Ht 64.0 in | Wt 155.0 lb

## 2023-04-11 DIAGNOSIS — Z113 Encounter for screening for infections with a predominantly sexual mode of transmission: Secondary | ICD-10-CM | POA: Diagnosis not present

## 2023-04-11 DIAGNOSIS — Z1329 Encounter for screening for other suspected endocrine disorder: Secondary | ICD-10-CM | POA: Diagnosis not present

## 2023-04-11 DIAGNOSIS — Z01419 Encounter for gynecological examination (general) (routine) without abnormal findings: Secondary | ICD-10-CM

## 2023-04-11 DIAGNOSIS — N939 Abnormal uterine and vaginal bleeding, unspecified: Secondary | ICD-10-CM | POA: Diagnosis not present

## 2023-04-11 MED ORDER — TRANEXAMIC ACID 650 MG PO TABS: 1300.0000 mg | ORAL_TABLET | Freq: Three times a day (TID) | ORAL | 2 refills | Status: AC

## 2023-04-11 NOTE — Patient Instructions (Addendum)
It was nice meeting you today! You will see your results in the MyChart app within 1-2 weeks.

## 2023-04-11 NOTE — Progress Notes (Signed)
ANNUAL EXAM Patient name: Chalice Philbert MRN [id number]  Date of birth: [birth year] Chief Complaint:   Annual Exam  History of Present Illness:   Cassie Hebert is a 42 y.o. [phone] with Patient's last menstrual period was 04/08/2023. being seen today for a routine annual exam.  Current complaints:   Painful heavy periods. Since 2018. Saturates a pad in <1 h on the heaviest days. Regular cycle. No intermenstrual bleeding   Upstream - 04/11/23 0933       Pregnancy Intention Screening   Does the patient want to become pregnant in the next year? No    Does the patient's partner want to become pregnant in the next year? No    Would the patient like to discuss contraceptive options today? No      Contraception Wrap Up   Current Method No Contraceptive Precautions    End Method No Contraception Precautions    Contraception Counseling Provided No    How was the end contraceptive method provided? N/A            The pregnancy intention screening data noted above was reviewed. Potential methods of contraception were discussed. The patient elected to proceed with No Contraception Precautions.   Last pap 2011. Results were:  normal per pt . H/O abnormal pap: no Last mammogram: never. Family h/o breast cancer: yes maternal grandmother (mid-50s) Last colonoscopy: never. Results were: N/A. Family h/o colorectal cancer: no     02/25/2023    8:16 AM 01/28/2023   10:24 AM  Depression screen PHQ 2/9  Decreased Interest 0 0  Down, Depressed, Hopeless 0 0  PHQ - 2 Score 0 0  Altered sleeping  0  Tired, decreased energy  2  Change in appetite  2  Feeling bad or failure about yourself   0  Trouble concentrating  0  Moving slowly or fidgety/restless  0  Suicidal thoughts  0  PHQ-9 Score  4  Difficult doing work/chores  Not difficult at all        01/28/2023   10:24 AM  GAD 7 : Generalized Anxiety Score  Nervous, Anxious, on Edge 1  Control/stop worrying 1  Worry too much - different  things 1  Trouble relaxing 0  Restless 0  Easily annoyed or irritable 1  Afraid - awful might happen 1  Total GAD 7 Score 5  Anxiety Difficulty Not difficult at all     Review of Systems:   Pertinent items are noted in HPI Denies any headaches, blurred vision, fatigue, shortness of breath, chest pain, abdominal pain, abnormal vaginal discharge/itching/odor/irritation, problems with periods, bowel movements, urination, or intercourse unless otherwise stated above. Pertinent History Reviewed:  Reviewed past medical,surgical, social and family history.  Reviewed problem list, medications and allergies. Physical Assessment:   Vitals:   04/11/23 0910  BP: (!) 143/83  Pulse: 74  Weight: 155 lb (70.3 kg)  Height: 5\' 4"  (1.626 m)  Body mass index is 26.61 kg/m.        Physical Examination:   General appearance - well appearing, and in no distress  Mental status - alert, oriented to person, place, and time  Chest - respiratory effort normal  Heart - normal peripheral perfusion  Breasts - breasts appear normal, no suspicious masses, no skin or nipple changes or axillary nodes  Abdomen - soft, nontender, nondistended, no masses or organomegaly  Pelvic - VULVA: normal appearing vulva with no masses, tenderness or lesions  VAGINA: normal appearing vagina with normal color  and discharge, no lesions  CERVIX: normal appearing cervix without discharge or lesions, no CMT  Thin prep pap is done with HR HPV cotesting  UTERUS: uterus slightly enlarged (11-12wk size), normal shape, consistency and nontender   ADNEXA: No adnexal masses or tenderness noted.  Chaperone present for exam  No results found for this or any previous visit (from the past 24 hour(s)).  Assessment & Plan:  1) Well-Woman Exam Mammogram: schedule screening mammo as soon as possible, or sooner if problems Colonoscopy: @ 45yo, or sooner if problems Pap: collected today GC/CT: collected HIV/HCV: ordered  2)  AUB Discussed options for management, pt opts for lysteda Reviewed role for EMB if changes in bleeding regularity, persistent symptoms of abnormal [place] -     CBC -     TSH Rfx on Abnormal to Free T4 -     US PELVIC COMPLETE WITH TRANSVAGINAL; Future -     tranexamic acid (LYSTEDA) 650 MG TABS tablet; Take 2 tablets (1,300 mg total) by mouth 3 (three) times daily. Take during menses for a maximum of five days  Labs/procedures today:   Orders Placed This Encounter  Procedures   MM 3D SCREENING MAMMOGRAM BILATERAL BREAST   US PELVIC COMPLETE WITH TRANSVAGINAL   CBC   TSH Rfx on Abnormal to Free T4   HIV antibody (with reflex)   Hepatitis C Antibody   RPR    Meds:  Meds ordered this encounter  Medications   tranexamic acid (LYSTEDA) 650 MG TABS tablet    Sig: Take 2 tablets (1,300 mg total) by mouth 3 (three) times daily. Take during menses for a maximum of five days    Dispense:  30 tablet    Refill:  2    Follow-up: Return in about 1 year (around 04/10/2024) for annual exam or sooner as needed for heavy periods.  Lennart Pall, MD 04/11/2023 9:46 AM

## 2023-04-13 LAB — HIV ANTIBODY (ROUTINE TESTING W REFLEX): HIV Screen 4th Generation wRfx: NONREACTIVE

## 2023-04-13 LAB — CBC
Hematocrit: 37.6 % (ref 34.0–46.6)
Hemoglobin: 12.7 g/dL (ref 11.1–15.9)
MCH: 32.4 pg (ref 26.6–33.0)
MCHC: 33.8 g/dL (ref 31.5–35.7)
MCV: 96 fL (ref 79–97)
Platelets: 234 10*3/uL (ref 150–450)
RBC: 3.92 x10E6/uL (ref 3.77–5.28)
RDW: 11.6 % — ABNORMAL LOW (ref 11.7–15.4)
WBC: 5.8 10*3/uL (ref 3.4–10.8)

## 2023-04-13 LAB — TSH RFX ON ABNORMAL TO FREE T4: TSH: 1.91 u[IU]/mL (ref 0.450–4.500)

## 2023-04-13 LAB — RPR: RPR Ser Ql: NONREACTIVE

## 2023-04-13 LAB — HEPATITIS C ANTIBODY: Hep C Virus Ab: NONREACTIVE

## 2023-04-15 DIAGNOSIS — M5416 Radiculopathy, lumbar region: Secondary | ICD-10-CM | POA: Diagnosis not present

## 2023-04-15 DIAGNOSIS — Z716 Tobacco abuse counseling: Secondary | ICD-10-CM | POA: Diagnosis not present

## 2023-04-15 LAB — CYTOLOGY - PAP
Chlamydia: NEGATIVE
Comment: NEGATIVE
Comment: NEGATIVE
Comment: NEGATIVE
Comment: NORMAL
Diagnosis: NEGATIVE
High risk HPV: NEGATIVE
Neisseria Gonorrhea: NEGATIVE
Trichomonas: NEGATIVE

## 2023-04-18 ENCOUNTER — Ambulatory Visit (INDEPENDENT_AMBULATORY_CARE_PROVIDER_SITE_OTHER): Payer: Medicaid Other

## 2023-04-18 DIAGNOSIS — N920 Excessive and frequent menstruation with regular cycle: Secondary | ICD-10-CM | POA: Diagnosis not present

## 2023-04-18 DIAGNOSIS — N939 Abnormal uterine and vaginal bleeding, unspecified: Secondary | ICD-10-CM

## 2023-04-18 DIAGNOSIS — Z01419 Encounter for gynecological examination (general) (routine) without abnormal findings: Secondary | ICD-10-CM

## 2023-04-22 ENCOUNTER — Encounter: Payer: Self-pay | Admitting: Obstetrics and Gynecology

## 2023-05-08 DIAGNOSIS — M5416 Radiculopathy, lumbar region: Secondary | ICD-10-CM | POA: Diagnosis not present

## 2023-05-29 ENCOUNTER — Ambulatory Visit: Payer: Medicaid Other

## 2023-05-30 ENCOUNTER — Encounter: Payer: Self-pay | Admitting: Obstetrics and Gynecology

## 2023-05-30 ENCOUNTER — Ambulatory Visit: Payer: Medicaid Other | Admitting: Obstetrics and Gynecology

## 2023-05-30 VITALS — BP 127/75 | HR 81 | Resp 16 | Ht 64.0 in | Wt 154.0 lb

## 2023-05-30 DIAGNOSIS — N939 Abnormal uterine and vaginal bleeding, unspecified: Secondary | ICD-10-CM

## 2023-05-30 NOTE — Progress Notes (Signed)
   RETURN GYNECOLOGY VISIT  Subjective:  Cassie Hebert is a 42 y.o. with approximate LMP 04/25/23 presenting for follow up of AUB  Seen by myself for annual exam on 7/18. Reported regular but painful & heavy menstrual cycles for 6+ years. We discussed trying lysteda and pt was agreeable. Work up included - CBC & TSH WNL, pelvic US obtained and concerning for possible polyp.   Presents today for f/u of her ultrasound. She tried the lysteda without any significant improvement in her bleeding. Reports that her symptoms are annoying but not affecting quality of life to the point that she'd want to try a different medication.   I personally reviewed the following: - CBC 04/11/23 Hgb 12.7 - Pap 04/11/23 NILM/HPV neg - TSH 04/11/23 1.91 - Pelvic US 04/18/23 8mm echogenic mass in endometrium c/f endometrial polyp, otherwise normal uterus & ovaries  Objective:   Vitals:   05/30/23 1006  BP: 127/75  Pulse: 81  Resp: 16  Weight: 154 lb (69.9 kg)  Height: 5\' 4"  (1.626 m)    General:  Alert, oriented and cooperative. Patient is in no acute distress.  Skin: Skin is warm and dry. No rash noted.   Cardiovascular: Normal heart rate noted  Respiratory: Normal respiratory effort, no problems with respiration noted  Abdomen: Soft, non-tender, non-distended   Pelvic: NEFG.   Exam performed in the presence of a chaperone  Assessment and Plan:  Cassie Hebert is a 42 y.o. with AUB  1. Abnormal uterine bleeding (AUB) Likely due to polyp Will obtain SHG to confirm diagnosis. If polyp persistent, plan for hsc D&C, polypectomy Follow up pending SHG result. Pt is not interested in starting medication at this time. - [place] Sonohysterogram; Future   Future Appointments  Date Time Provider Department Center  07/04/2023  9:20 AM [facility]  08/27/2023  8:30 AM Charlton Amor, DO [facility] None   Lennart Pall, MD

## 2023-05-31 DIAGNOSIS — M5416 Radiculopathy, lumbar region: Secondary | ICD-10-CM | POA: Diagnosis not present

## 2023-07-04 ENCOUNTER — Ambulatory Visit: Payer: Medicaid Other

## 2023-07-04 DIAGNOSIS — Z1231 Encounter for screening mammogram for malignant neoplasm of breast: Secondary | ICD-10-CM

## 2023-07-04 DIAGNOSIS — Z01419 Encounter for gynecological examination (general) (routine) without abnormal findings: Secondary | ICD-10-CM

## 2023-07-09 ENCOUNTER — Other Ambulatory Visit: Payer: Self-pay | Admitting: Obstetrics and Gynecology

## 2023-07-09 DIAGNOSIS — R928 Other abnormal and inconclusive findings on diagnostic imaging of breast: Secondary | ICD-10-CM

## 2023-07-10 ENCOUNTER — Other Ambulatory Visit: Payer: Self-pay | Admitting: Obstetrics and Gynecology

## 2023-07-10 ENCOUNTER — Ambulatory Visit
Admission: RE | Admit: 2023-07-10 | Discharge: 2023-07-10 | Disposition: A | Discharge status: Home / Self Care | Payer: Medicaid Other | Source: Ambulatory Visit | Attending: Obstetrics and Gynecology | Admitting: Obstetrics and Gynecology

## 2023-07-10 DIAGNOSIS — R928 Other abnormal and inconclusive findings on diagnostic imaging of breast: Secondary | ICD-10-CM | POA: Diagnosis not present

## 2023-07-10 DIAGNOSIS — N6320 Unspecified lump in the left breast, unspecified quadrant: Secondary | ICD-10-CM

## 2023-07-10 DIAGNOSIS — N6323 Unspecified lump in the left breast, lower outer quadrant: Secondary | ICD-10-CM | POA: Diagnosis not present

## 2023-07-10 DIAGNOSIS — N6325 Unspecified lump in the left breast, overlapping quadrants: Secondary | ICD-10-CM | POA: Diagnosis not present

## 2023-07-10 DIAGNOSIS — N6310 Unspecified lump in the right breast, unspecified quadrant: Secondary | ICD-10-CM

## 2023-07-11 ENCOUNTER — Ambulatory Visit
Admission: RE | Admit: 2023-07-11 | Discharge: 2023-07-11 | Disposition: A | Discharge status: Home / Self Care | Payer: Medicaid Other | Source: Ambulatory Visit | Attending: Obstetrics and Gynecology | Admitting: Obstetrics and Gynecology

## 2023-07-11 DIAGNOSIS — N6320 Unspecified lump in the left breast, unspecified quadrant: Secondary | ICD-10-CM

## 2023-07-11 DIAGNOSIS — N6325 Unspecified lump in the left breast, overlapping quadrants: Secondary | ICD-10-CM | POA: Diagnosis not present

## 2023-07-11 DIAGNOSIS — N6323 Unspecified lump in the left breast, lower outer quadrant: Secondary | ICD-10-CM | POA: Diagnosis not present

## 2023-07-11 DIAGNOSIS — N6012 Diffuse cystic mastopathy of left breast: Secondary | ICD-10-CM | POA: Diagnosis not present

## 2023-07-11 DIAGNOSIS — N6032 Fibrosclerosis of left breast: Secondary | ICD-10-CM | POA: Diagnosis not present

## 2023-07-11 HISTORY — PX: BREAST BIOPSY: SHX20

## 2023-07-12 LAB — SURGICAL PATHOLOGY

## 2023-08-27 ENCOUNTER — Ambulatory Visit: Payer: Medicaid Other | Admitting: Family Medicine

## 2023-08-27 NOTE — Progress Notes (Unsigned)
     Established patient visit   Patient: Cassie Hebert   DOB: July 28, 1981   42 y.o. Female  MRN: [id number] Visit Date: 08/27/2023  Today's healthcare provider: Charlton Amor, DO   No chief complaint on file.   SUBJECTIVE   No chief complaint on file.  HPI   Pt presents for follow up on R hip pain and chronic bilateral low back pain without sciatica. At last visit, hip pain improved with PT and she was referred to ortho spine for chronic back pain.  Review of Systems     No outpatient medications have been marked as taking for the 08/27/23 encounter (Appointment) with Charlton Amor, DO.    OBJECTIVE    There were no vitals taken for this visit.  Physical Exam     ASSESSMENT & PLAN    Problem List Items Addressed This Visit   None   No follow-ups on file.      No orders of the defined types were placed in this encounter.   No orders of the defined types were placed in this encounter.    Charlton Amor, DO  [facility] at [facility] [phone] (phone) [phone] (fax)  [facility]

## 2023-10-11 ENCOUNTER — Encounter: Payer: Medicaid Other | Admitting: Family Medicine

## 2024-01-30 ENCOUNTER — Encounter: Payer: Self-pay | Admitting: Family Medicine

## 2024-03-13 DIAGNOSIS — N9089 Other specified noninflammatory disorders of vulva and perineum: Secondary | ICD-10-CM | POA: Diagnosis not present

## 2024-05-27 DIAGNOSIS — N9089 Other specified noninflammatory disorders of vulva and perineum: Secondary | ICD-10-CM | POA: Diagnosis not present

## 2024-06-29 DIAGNOSIS — M5416 Radiculopathy, lumbar region: Secondary | ICD-10-CM | POA: Diagnosis not present

## 2024-07-08 ENCOUNTER — Ambulatory Visit: Admitting: Obstetrics and Gynecology

## 2024-07-22 DIAGNOSIS — M5416 Radiculopathy, lumbar region: Secondary | ICD-10-CM | POA: Diagnosis not present

## 2024-08-17 DIAGNOSIS — M5416 Radiculopathy, lumbar region: Secondary | ICD-10-CM | POA: Diagnosis not present

## 2024-09-03 DIAGNOSIS — M5416 Radiculopathy, lumbar region: Secondary | ICD-10-CM | POA: Diagnosis not present

## 2024-10-27 ENCOUNTER — Other Ambulatory Visit: Payer: Self-pay | Admitting: Orthopedic Surgery

## 2024-11-12 ENCOUNTER — Ambulatory Visit (HOSPITAL_COMMUNITY): Admit: 2024-11-12 | Admitting: Orthopedic Surgery
